# Patient Record
Sex: Male | Born: 1980 | Race: White | Hispanic: No | Marital: Married | Smoking: Never smoker
Health system: Southern US, Community
[De-identification: ages and names within clinical notes are randomized; demographics above are authoritative.]

## PROBLEM LIST (undated history)

## (undated) DIAGNOSIS — M629 Disorder of muscle, unspecified: Secondary | ICD-10-CM

## (undated) DIAGNOSIS — F419 Anxiety disorder, unspecified: Secondary | ICD-10-CM

## (undated) HISTORY — PX: EYE SURGERY: SHX253

## (undated) HISTORY — DX: Anxiety disorder, unspecified: F41.9

## (undated) HISTORY — DX: Disorder of muscle, unspecified: M62.9

---

## 1998-09-29 ENCOUNTER — Emergency Department (HOSPITAL_COMMUNITY): Admission: EM | Admit: 1998-09-29 | Discharge: 1998-09-30 | Payer: Self-pay | Admitting: Emergency Medicine

## 2002-02-15 ENCOUNTER — Emergency Department (HOSPITAL_COMMUNITY): Admission: EM | Admit: 2002-02-15 | Discharge: 2002-02-16 | Payer: Self-pay | Admitting: Emergency Medicine

## 2015-10-29 DIAGNOSIS — F419 Anxiety disorder, unspecified: Secondary | ICD-10-CM | POA: Diagnosis not present

## 2015-10-29 DIAGNOSIS — F900 Attention-deficit hyperactivity disorder, predominantly inattentive type: Secondary | ICD-10-CM | POA: Diagnosis not present

## 2015-10-31 MED FILL — traZODone HCL 50 MG TABS: 50 | 30 days supply | Qty: 30 | Fill #1

## 2015-10-31 MED FILL — hydrOXYzine HCL 25 MG TABS: 25 | 30 days supply | Qty: 60 | Fill #0

## 2015-10-31 MED FILL — CITALOPRAM HBR 20 MG TABLET: 20 | 30 days supply | Qty: 30 | Fill #1

## 2015-10-31 MED FILL — AMPHETAMINE SALTS 15 MG TAB: 15 | 30 days supply | Qty: 90 | Fill #0

## 2015-12-02 MED FILL — AMPHETAMINE SALTS 15 MG TAB: 15 | 30 days supply | Qty: 90 | Fill #0

## 2015-12-26 DIAGNOSIS — F419 Anxiety disorder, unspecified: Secondary | ICD-10-CM | POA: Diagnosis not present

## 2015-12-26 DIAGNOSIS — F900 Attention-deficit hyperactivity disorder, predominantly inattentive type: Secondary | ICD-10-CM | POA: Diagnosis not present

## 2015-12-30 MED FILL — CITALOPRAM HBR 20 MG TABLET: 20 | 30 days supply | Qty: 30 | Fill #0

## 2015-12-30 MED FILL — traZODone HCL 50 MG TABS: 50 | 30 days supply | Qty: 30 | Fill #0

## 2015-12-30 MED FILL — hydrOXYzine HCL 25 MG TABS: 25 | 30 days supply | Qty: 60 | Fill #0

## 2015-12-30 MED FILL — AMPHETAMINE SALTS 15 MG TAB: 15 | 30 days supply | Qty: 90 | Fill #0

## 2016-01-28 MED FILL — AMPHETAMINE SALTS 15 MG TAB: 15 | 30 days supply | Qty: 90 | Fill #0

## 2016-02-26 MED FILL — hydrOXYzine HCL 25 MG TABS: 25 | 30 days supply | Qty: 60 | Fill #1

## 2016-02-26 MED FILL — AMPHETAMINE SALTS 15 MG TAB: 15 | 30 days supply | Qty: 90 | Fill #0

## 2016-02-26 MED FILL — CITALOPRAM HBR 20 MG TABLET: 20 | 30 days supply | Qty: 30 | Fill #1

## 2016-02-26 MED FILL — traZODone HCL 50 MG TABS: 50 | 30 days supply | Qty: 30 | Fill #1

## 2016-03-06 DIAGNOSIS — F900 Attention-deficit hyperactivity disorder, predominantly inattentive type: Secondary | ICD-10-CM | POA: Diagnosis not present

## 2016-03-06 DIAGNOSIS — F419 Anxiety disorder, unspecified: Secondary | ICD-10-CM | POA: Diagnosis not present

## 2016-03-26 MED FILL — traZODone HCL 50 MG TABS: 50 | 30 days supply | Qty: 30 | Fill #0

## 2016-03-26 MED FILL — CITALOPRAM HBR 20 MG TABLET: 20 | 30 days supply | Qty: 30 | Fill #0

## 2016-03-26 MED FILL — AMPHETAMINE SALTS 15 MG TAB: 15 | 30 days supply | Qty: 90 | Fill #0

## 2016-03-26 MED FILL — hydrOXYzine HCL 25 MG TABS: 25 | 30 days supply | Qty: 60 | Fill #0

## 2016-04-20 MED FILL — OSELTAMIVIR PHOS 75 MG CAP: 75 | 10 days supply | Qty: 10 | Fill #0

## 2016-04-23 MED FILL — hydrOXYzine HCL 25 MG TABS: 25 | 30 days supply | Qty: 60 | Fill #1

## 2016-04-23 MED FILL — CITALOPRAM HBR 20 MG TABLET: 20 | 30 days supply | Qty: 30 | Fill #1

## 2016-04-23 MED FILL — traZODone HCL 50 MG TABS: 50 | 30 days supply | Qty: 30 | Fill #1

## 2016-04-23 MED FILL — AMPHETAMINE SALTS 15 MG TAB: 15 | 30 days supply | Qty: 90 | Fill #0

## 2016-05-01 DIAGNOSIS — F419 Anxiety disorder, unspecified: Secondary | ICD-10-CM | POA: Diagnosis not present

## 2016-05-01 DIAGNOSIS — F900 Attention-deficit hyperactivity disorder, predominantly inattentive type: Secondary | ICD-10-CM | POA: Diagnosis not present

## 2016-05-26 MED FILL — CITALOPRAM HBR 20 MG TABLET: 20 | 30 days supply | Qty: 30 | Fill #0

## 2016-05-26 MED FILL — hydrOXYzine HCL 25 MG TABS: 25 | 30 days supply | Qty: 60 | Fill #0

## 2016-05-26 MED FILL — traZODone HCL 50 MG TABS: 50 | 30 days supply | Qty: 30 | Fill #0

## 2016-05-26 MED FILL — AMPHETAMINE SALTS 15 MG TAB: 15 | 30 days supply | Qty: 90 | Fill #0

## 2016-06-24 MED FILL — traZODone HCL 50 MG TABS: 50 | 30 days supply | Qty: 30 | Fill #0

## 2016-06-24 MED FILL — CITALOPRAM HBR 20 MG TABLET: 20 | 30 days supply | Qty: 30 | Fill #0

## 2016-06-24 MED FILL — hydrOXYzine HCL 25 MG TABS: 25 | 30 days supply | Qty: 60 | Fill #0

## 2016-06-24 MED FILL — AMPHETAMINE SALTS 15 MG TAB: 15 | 30 days supply | Qty: 90 | Fill #0

## 2016-07-07 DIAGNOSIS — F419 Anxiety disorder, unspecified: Secondary | ICD-10-CM | POA: Diagnosis not present

## 2016-07-07 DIAGNOSIS — F900 Attention-deficit hyperactivity disorder, predominantly inattentive type: Secondary | ICD-10-CM | POA: Diagnosis not present

## 2016-07-22 MED FILL — AMPHETAMINE SALTS 15 MG TAB: 15 | 30 days supply | Qty: 90 | Fill #0

## 2016-08-20 MED FILL — AMPHETAMINE SALTS 15 MG TAB: 15 | 30 days supply | Qty: 90 | Fill #0

## 2016-09-01 DIAGNOSIS — F419 Anxiety disorder, unspecified: Secondary | ICD-10-CM | POA: Diagnosis not present

## 2016-09-01 DIAGNOSIS — F900 Attention-deficit hyperactivity disorder, predominantly inattentive type: Secondary | ICD-10-CM | POA: Diagnosis not present

## 2016-09-17 MED FILL — hydrOXYzine HCL 25 MG TABS: 25 | 30 days supply | Qty: 60 | Fill #0

## 2016-09-17 MED FILL — AMPHETAMINE SALTS 15 MG TAB: 15 | 30 days supply | Qty: 90 | Fill #0

## 2016-09-17 MED FILL — traZODone HCL 50 MG TABS: 50 | 30 days supply | Qty: 30 | Fill #0

## 2016-09-17 MED FILL — CITALOPRAM HBR 40 MG TABLET: 40 | 30 days supply | Qty: 15 | Fill #0

## 2016-10-16 MED FILL — AMPHETAMINE SALTS 15 MG TAB: 15 | 30 days supply | Qty: 90 | Fill #0

## 2016-10-16 MED FILL — CITALOPRAM HBR 40 MG TABLET: 40 | 30 days supply | Qty: 15 | Fill #1

## 2016-10-16 MED FILL — hydrOXYzine HCL 25 MG TABS: 25 | 30 days supply | Qty: 60 | Fill #1

## 2016-10-16 MED FILL — traZODone HCL 50 MG TABS: 50 | 30 days supply | Qty: 30 | Fill #1

## 2016-10-27 DIAGNOSIS — F419 Anxiety disorder, unspecified: Secondary | ICD-10-CM | POA: Diagnosis not present

## 2016-10-27 DIAGNOSIS — F900 Attention-deficit hyperactivity disorder, predominantly inattentive type: Secondary | ICD-10-CM | POA: Diagnosis not present

## 2016-11-16 MED FILL — AMPHETAMINE SALTS 15 MG TAB: 15 | 30 days supply | Qty: 90 | Fill #0

## 2016-12-22 DIAGNOSIS — F900 Attention-deficit hyperactivity disorder, predominantly inattentive type: Secondary | ICD-10-CM | POA: Diagnosis not present

## 2016-12-22 DIAGNOSIS — F419 Anxiety disorder, unspecified: Secondary | ICD-10-CM | POA: Diagnosis not present

## 2016-12-22 MED FILL — traZODone HCL 50 MG TABS: 50 | 30 days supply | Qty: 30 | Fill #0

## 2016-12-22 MED FILL — AMPHETAMINE SALTS 15 MG TAB: 15 | 30 days supply | Qty: 90 | Fill #0

## 2016-12-22 MED FILL — CITALOPRAM HBR 20 MG TABLET: 20 | 30 days supply | Qty: 30 | Fill #0

## 2016-12-22 MED FILL — hydrOXYzine HCL 25 MG TABS: 25 | 30 days supply | Qty: 60 | Fill #0

## 2017-01-22 MED FILL — AMPHETAMINE SALTS 15 MG TAB: 15 | 30 days supply | Qty: 90 | Fill #0

## 2017-02-04 ENCOUNTER — Ambulatory Visit (INDEPENDENT_AMBULATORY_CARE_PROVIDER_SITE_OTHER): Payer: 59 | Admitting: Emergency Medicine

## 2017-02-04 ENCOUNTER — Ambulatory Visit (INDEPENDENT_AMBULATORY_CARE_PROVIDER_SITE_OTHER): Payer: 59

## 2017-02-04 VITALS — BP 122/82 | HR 107 | Temp 98.8°F | Resp 18 | Wt 198.8 lb

## 2017-02-04 DIAGNOSIS — K2900 Acute gastritis without bleeding: Secondary | ICD-10-CM | POA: Insufficient documentation

## 2017-02-04 DIAGNOSIS — R1013 Epigastric pain: Secondary | ICD-10-CM

## 2017-02-04 MED ORDER — RANITIDINE HCL 300 MG PO TABS: 300.0000 mg | ORAL_TABLET | Freq: Every day | ORAL | 1 refills | Status: DC

## 2017-02-04 MED ORDER — OMEPRAZOLE 40 MG PO CPDR: 40.0000 mg | DELAYED_RELEASE_CAPSULE | Freq: Every day | ORAL | 3 refills | Status: DC

## 2017-02-04 MED FILL — OMEPRAZOLE DR 40 MG CAPSULE: 40 | 30 days supply | Qty: 30 | Fill #0

## 2017-02-04 MED FILL — raNITIdine HCL 300 MG TABS: 300 | 10 days supply | Qty: 10 | Fill #0

## 2017-02-04 NOTE — Patient Instructions (Addendum)
  Stop Ibuprofen; take Tylenol as needed for pain. Take Maalox 2-3 times/day for the next 5 days.   IF you received an x-ray today, you will receive an invoice from [facility]. Please contact [facility] at [phone] with questions or concerns regarding your invoice.   IF you received labwork today, you will receive an invoice from [place]. Please contact [facility] at [phone] with questions or concerns regarding your invoice.   Our billing staff will not be able to assist you with questions regarding bills from these companies.  You will be contacted with the lab results as soon as they are available. The fastest way to get your results is to activate your My Chart account. Instructions are located on the last page of this paperwork. If you have not heard from [place] regarding the results in 2 weeks, please contact this office.      Gastritis, Adult Gastritis is swelling (inflammation) of the stomach. When you have this condition, you can have these problems (symptoms):  Pain in your stomach.  A burning feeling in your stomach.  Feeling sick to your stomach (nauseous).  Throwing up (vomiting).  Feeling too full after you eat. It is important to get help for this condition. Without help, your stomach can bleed, and you can get sores (ulcers) in your stomach. Follow these instructions at home:  Take over-the-counter and prescription medicines only as told by your doctor.  If you were prescribed an antibiotic medicine, take it as told by your doctor. Do not stop taking it even if you start to feel better.  Drink enough fluid to keep your pee (urine) clear or pale yellow.  Instead of eating big meals, eat small meals often. Contact a health care provider if:  Your problems get worse.  Your problems go away and then come back. Get help right away if:  You throw up blood or something that looks like coffee grounds.  You have black or dark red poop  (stools).  You cannot keep fluids down.  Your stomach pain gets worse.  You have a fever.  You do not feel better after 1 week. This information is not intended to replace advice given to you by your health care provider. Make sure you discuss any questions you have with your health care provider. Document Released: 03/16/2008 Document Revised: 05/27/2016 Document Reviewed: 06/22/2015 Elsevier Interactive Patient Education  2017 [employer].

## 2017-02-04 NOTE — Progress Notes (Signed)
Isaac Ellis 36 y.o.   Chief Complaint  Patient presents with  . Abdominal Pain    upper area x1day   . Shortness of Breath    pt says cant take a full breath  . Shoulder Pain    left shoulder   . Back Pain    HISTORY OF PRESENT ILLNESS: This is a 36 y.o. male complaining of epigastric pain since 1am today; no other significant symptoms; pain radiated to left shoulder; Father had MI at age 22 (smoker). Pt denies CAD risk factors. Works out (runs) regularly and does not get any chest pains. Has been taking Ibuprofen for back pain for the past week.  HPI   Prior to Admission medications   Medication Sig Start Date End Date Taking? Authorizing Provider  amphetamine-dextroamphetamine (ADDERALL) 30 MG tablet Take 30 mg by mouth daily.   Yes Historical Provider, MD    Not on File  There are no active problems to display for this patient.   Past Medical History:  Diagnosis Date  . Anxiety     Past Surgical History:  Procedure Laterality Date  . EYE SURGERY      Social History   Social History  . Marital status: Married    Spouse name: N/A  . Number of children: N/A  . Years of education: N/A   Occupational History  . Not on file.   Social History Main Topics  . Smoking status: Never Smoker  . Smokeless tobacco: Never Used  . Alcohol use No  . Drug use: Unknown  . Sexual activity: Not on file   Other Topics Concern  . Not on file   Social History Narrative  . No narrative on file    Family History  Problem Relation Age of Onset  . Cancer Mother   . Heart disease Father   . Hyperlipidemia Father   . Hypertension Father   . Cancer Sister   . Cancer Maternal Grandfather   . Stroke Paternal Grandmother   . Cancer Paternal Grandfather      Review of Systems  Constitutional: Negative for chills, fever, malaise/fatigue and weight loss.  HENT: Negative.   Eyes: Negative.   Respiratory: Negative.  Negative for cough, hemoptysis and shortness of  breath.   Cardiovascular: Negative for chest pain and leg swelling.  Gastrointestinal: Positive for abdominal pain. Negative for blood in stool, constipation, diarrhea, melena, nausea and vomiting.  Genitourinary: Negative.  Negative for dysuria and hematuria.  Musculoskeletal: Positive for back pain. Negative for myalgias.  Skin: Negative for rash.  Neurological: Negative for dizziness and headaches.  Endo/Heme/Allergies: Negative.   All other systems reviewed and are negative.  EKG: NSR with no acute ischemic changes. CXR: NAD Vitals:   02/04/17 1052  BP: 122/82  Pulse: (!) 107  Resp: 18  Temp: 98.8 F (37.1 C)    Physical Exam  Constitutional: He is oriented to person, place, and time. He appears well-developed and well-nourished.  HENT:  Head: Normocephalic and atraumatic.  Nose: Nose normal.  Mouth/Throat: Oropharynx is clear and moist.  Eyes: Conjunctivae and EOM are normal. Pupils are equal, round, and reactive to light.  Neck: Normal range of motion. Neck supple. No JVD present. No thyromegaly present.  Cardiovascular: Normal rate, regular rhythm, normal heart sounds and intact distal pulses.   Pulmonary/Chest: Effort normal and breath sounds normal.  Abdominal: Bowel sounds are normal. He exhibits no distension and no mass. There is tenderness (epigastrum). There is no rebound and no  guarding.  Musculoskeletal: Normal range of motion.  Lymphadenopathy:    He has no cervical adenopathy.  Neurological: He is alert and oriented to person, place, and time. No sensory deficit. He exhibits normal muscle tone.  Skin: Skin is warm and dry. Capillary refill takes less than 2 seconds. No rash noted.  Psychiatric: He has a normal mood and affect. His behavior is normal.  Vitals reviewed.    ASSESSMENT & PLAN: Isaac Ellis was seen today for abdominal pain, shortness of breath, shoulder pain and back pain.  Diagnoses and all orders for this visit:  Abdominal pain, epigastric -      CBC with Differential/Platelet -     Comprehensive metabolic panel -     EKG 12-Lead -     DG Chest 2 View; Future -     Amylase  Acute gastritis without hemorrhage, unspecified gastritis type  Other orders -     omeprazole (PRILOSEC) 40 MG capsule; Take 1 capsule (40 mg total) by mouth daily. -     ranitidine (ZANTAC) 300 MG tablet; Take 1 tablet (300 mg total) by mouth at bedtime.    Patient Instructions    Stop Ibuprofen; take Tylenol as needed for pain. Take Maalox 2-3 times/day for the next 5 days.   IF you received an x-ray today, you will receive an invoice from [facility]. Please contact [facility] at [phone] with questions or concerns regarding your invoice.   IF you received labwork today, you will receive an invoice from [place]. Please contact [facility] at [phone] with questions or concerns regarding your invoice.   Our billing staff will not be able to assist you with questions regarding bills from these companies.  You will be contacted with the lab results as soon as they are available. The fastest way to get your results is to activate your My Chart account. Instructions are located on the last page of this paperwork. If you have not heard from [place] regarding the results in 2 weeks, please contact this office.      Gastritis, Adult Gastritis is swelling (inflammation) of the stomach. When you have this condition, you can have these problems (symptoms):  Pain in your stomach.  A burning feeling in your stomach.  Feeling sick to your stomach (nauseous).  Throwing up (vomiting).  Feeling too full after you eat. It is important to get help for this condition. Without help, your stomach can bleed, and you can get sores (ulcers) in your stomach. Follow these instructions at home:  Take over-the-counter and prescription medicines only as told by your doctor.  If you were prescribed an antibiotic medicine, take it as told by your  doctor. Do not stop taking it even if you start to feel better.  Drink enough fluid to keep your pee (urine) clear or pale yellow.  Instead of eating big meals, eat small meals often. Contact a health care provider if:  Your problems get worse.  Your problems go away and then come back. Get help right away if:  You throw up blood or something that looks like coffee grounds.  You have black or dark red poop (stools).  You cannot keep fluids down.  Your stomach pain gets worse.  You have a fever.  You do not feel better after 1 week. This information is not intended to replace advice given to you by your health care provider. Make sure you discuss any questions you have with your health care provider. Document Released: 03/16/2008 Document  Revised: 05/27/2016 Document Reviewed: 06/22/2015 Elsevier Interactive Patient Education  2017 Elsevier Inc.      Edwina Barth, MD [facility]

## 2017-02-05 LAB — COMPREHENSIVE METABOLIC PANEL
ALBUMIN: 4.7 g/dL (ref 3.5–5.5)
ALT: 33 IU/L (ref 0–44)
AST: 17 IU/L (ref 0–40)
Albumin/Globulin Ratio: 2 (ref 1.2–2.2)
Alkaline Phosphatase: 88 IU/L (ref 39–117)
BILIRUBIN TOTAL: 1.3 mg/dL — AB (ref 0.0–1.2)
BUN / CREAT RATIO: 15 (ref 9–20)
BUN: 14 mg/dL (ref 6–20)
CALCIUM: 10 mg/dL (ref 8.7–10.2)
CO2: 27 mmol/L (ref 18–29)
CREATININE: 0.93 mg/dL (ref 0.76–1.27)
Chloride: 100 mmol/L (ref 96–106)
GFR, EST AFRICAN AMERICAN: 121 mL/min/{1.73_m2} (ref >59)
GFR, EST NON AFRICAN AMERICAN: 105 mL/min/{1.73_m2} (ref >59)
GLUCOSE: 85 mg/dL (ref 65–99)
Globulin, Total: 2.4 g/dL (ref 1.5–4.5)
Potassium: 5.1 mmol/L (ref 3.5–5.2)
Sodium: 141 mmol/L (ref 134–144)
TOTAL PROTEIN: 7.1 g/dL (ref 6.0–8.5)

## 2017-02-05 LAB — CBC WITH DIFFERENTIAL/PLATELET
BASOS ABS: 0 10*3/uL (ref 0.0–0.2)
BASOS: 0 %
EOS (ABSOLUTE): 0.1 10*3/uL (ref 0.0–0.4)
Eos: 1 %
HEMOGLOBIN: 16 g/dL (ref 13.0–17.7)
Hematocrit: 47.9 % (ref 37.5–51.0)
IMMATURE GRANS (ABS): 0 10*3/uL (ref 0.0–0.1)
IMMATURE GRANULOCYTES: 0 %
LYMPHS: 11 %
Lymphocytes Absolute: 1.1 10*3/uL (ref 0.7–3.1)
MCH: 30 pg (ref 26.6–33.0)
MCHC: 33.4 g/dL (ref 31.5–35.7)
MCV: 90 fL (ref 79–97)
MONOCYTES: 10 %
Monocytes Absolute: 1 10*3/uL — ABNORMAL HIGH (ref 0.1–0.9)
NEUTROS ABS: 7.6 10*3/uL — AB (ref 1.4–7.0)
Neutrophils: 78 %
Platelets: 279 10*3/uL (ref 150–379)
RBC: 5.34 x10E6/uL (ref 4.14–5.80)
RDW: 14.2 % (ref 12.3–15.4)
WBC: 9.9 10*3/uL (ref 3.4–10.8)

## 2017-02-05 LAB — AMYLASE: Amylase: 71 U/L (ref 31–124)

## 2017-02-16 DIAGNOSIS — F900 Attention-deficit hyperactivity disorder, predominantly inattentive type: Secondary | ICD-10-CM | POA: Diagnosis not present

## 2017-02-16 DIAGNOSIS — F419 Anxiety disorder, unspecified: Secondary | ICD-10-CM | POA: Diagnosis not present

## 2017-02-16 MED FILL — CITALOPRAM HBR 20 MG TABLET: 20 | 30 days supply | Qty: 30 | Fill #0

## 2017-02-16 MED FILL — PROPRANOLOL 20 MG TABLET: 20 | 30 days supply | Qty: 60 | Fill #0

## 2017-02-16 MED FILL — traZODone HCL 50 MG TABS: 50 | 30 days supply | Qty: 30 | Fill #0

## 2017-02-22 MED FILL — AMPHETAMINE SALTS 15 MG TAB: 15 | 30 days supply | Qty: 90 | Fill #0

## 2017-03-22 MED FILL — AMPHETAMINE SALTS 15 MG TAB: 15 | 30 days supply | Qty: 90 | Fill #0

## 2017-04-13 DIAGNOSIS — F419 Anxiety disorder, unspecified: Secondary | ICD-10-CM | POA: Diagnosis not present

## 2017-04-13 DIAGNOSIS — F900 Attention-deficit hyperactivity disorder, predominantly inattentive type: Secondary | ICD-10-CM | POA: Diagnosis not present

## 2017-04-13 MED FILL — PROPRANOLOL 20 MG TABLET: 20 | 30 days supply | Qty: 60 | Fill #0

## 2017-04-13 MED FILL — CITALOPRAM HBR 20 MG TABLET: 20 | 30 days supply | Qty: 30 | Fill #0

## 2017-04-13 MED FILL — traZODone HCL 50 MG TABS: 50 | 30 days supply | Qty: 30 | Fill #0

## 2017-04-23 MED FILL — AMPHETAMINE SALTS 15 MG TAB: 15 | 30 days supply | Qty: 90 | Fill #0

## 2017-05-21 MED FILL — AMPHETAMINE SALTS 15 MG TAB: 15 | 30 days supply | Qty: 90 | Fill #0

## 2017-06-15 ENCOUNTER — Ambulatory Visit (INDEPENDENT_AMBULATORY_CARE_PROVIDER_SITE_OTHER): Payer: 59 | Admitting: Orthopaedic Surgery

## 2017-06-15 ENCOUNTER — Ambulatory Visit (INDEPENDENT_AMBULATORY_CARE_PROVIDER_SITE_OTHER): Payer: 59

## 2017-06-15 ENCOUNTER — Encounter (INDEPENDENT_AMBULATORY_CARE_PROVIDER_SITE_OTHER): Payer: Self-pay | Admitting: Orthopaedic Surgery

## 2017-06-15 DIAGNOSIS — M5416 Radiculopathy, lumbar region: Secondary | ICD-10-CM | POA: Insufficient documentation

## 2017-06-15 MED ORDER — PREDNISONE 10 MG (21) PO TBPK: ORAL_TABLET | ORAL | 0 refills | Status: DC

## 2017-06-15 MED ORDER — NAPROXEN 500 MG PO TABS: 500.0000 mg | ORAL_TABLET | Freq: Two times a day (BID) | ORAL | 3 refills | Status: DC

## 2017-06-15 MED ORDER — TIZANIDINE HCL 4 MG PO TABS: 4.0000 mg | ORAL_TABLET | Freq: Four times a day (QID) | ORAL | 2 refills | Status: DC | PRN

## 2017-06-15 MED ORDER — HYDROCODONE-ACETAMINOPHEN 5-325 MG PO TABS: 1.0000 | ORAL_TABLET | Freq: Two times a day (BID) | ORAL | 0 refills | Status: DC | PRN

## 2017-06-15 MED FILL — predniSONE 10 MG TABS: 10 | 6 days supply | Qty: 21 | Fill #0

## 2017-06-15 MED FILL — tiZANidine HCL 4 MG TABS: 4 | 5 days supply | Qty: 30 | Fill #0

## 2017-06-15 MED FILL — NAPROXEN 500 MG TABLET: 500 | 15 days supply | Qty: 30 | Fill #0

## 2017-06-15 MED FILL — HYDROCODON-APAP 5-325: 5-325 | 10 days supply | Qty: 20 | Fill #0

## 2017-06-15 NOTE — Progress Notes (Signed)
Office Visit Note   Patient: Isaac Ellis           Date of Birth: 1981/05/31           MRN: [id number] Visit Date: 06/15/2017              Requested by: No referring provider defined for this encounter. PCP: Patient, No Pcp Per   Assessment & Plan: Visit Diagnoses:  1. Lumbar radiculopathy, acute     Plan: Overall impression is lumbar radiculopathy. Prescriptions given today. If not better we'll consider MRI.  Follow-Up Instructions: Return if symptoms worsen or fail to improve.   Orders:  Orders Placed This Encounter  Procedures  . XR HIP UNILAT W OR W/O PELVIS 2-3 VIEWS RIGHT   Meds ordered this encounter  Medications  . HYDROcodone-acetaminophen (NORCO) 5-325 MG tablet    Sig: Take 1 tablet by mouth 2 (two) times daily as needed.    Dispense:  20 tablet    Refill:  0  . predniSONE (STERAPRED UNI-PAK 21 TAB) 10 MG (21) TBPK tablet    Sig: Take as directed    Dispense:  21 tablet    Refill:  0  . tiZANidine (ZANAFLEX) 4 MG tablet    Sig: Take 1 tablet (4 mg total) by mouth every 6 (six) hours as needed for muscle spasms.    Dispense:  30 tablet    Refill:  2  . naproxen (NAPROSYN) 500 MG tablet    Sig: Take 1 tablet (500 mg total) by mouth 2 (two) times daily with a meal.    Dispense:  30 tablet    Refill:  3      Procedures: No procedures performed   Clinical Data: No additional findings.   Subjective: Chief Complaint  Patient presents with  . Right Hip - Pain    Patient is a 36 year old male who has had 1 month history of burning pain radiating from his low back down to the top of his foot. The pain is severe with activity and with standing. He has pain when he coughs and sneezes. Denies any bowel or bladder dysfunction.    Review of Systems  Constitutional: Negative.   All other systems reviewed and are negative.    Objective: Vital Signs: There were no vitals taken for this visit.  Physical Exam  Constitutional: He is oriented to  person, place, and time. He appears well-developed and well-nourished.  HENT:  Head: Normocephalic and atraumatic.  Eyes: Pupils are equal, round, and reactive to light.  Neck: Neck supple.  Pulmonary/Chest: Effort normal.  Abdominal: Soft.  Musculoskeletal: Normal range of motion.  Neurological: He is alert and oriented to person, place, and time.  Skin: Skin is warm.  Psychiatric: He has a normal mood and affect. His behavior is normal. Judgment and thought content normal.  Nursing note and vitals reviewed.   Ortho Exam Right lower extremity exam shows positive straight leg raise test. Hip range of motion is asymptomatic. Lateral hip is nontender. No focal motor or sensory deficits. Slightly hyperreflexic symmetric patellar reflexes Specialty Comments:  No specialty comments available.  Imaging: Xr Hip Unilat W Or W/o Pelvis 2-3 Views Right  Result Date: 06/15/2017 Negative for acute abnormalities    PMFS History: Patient Active Problem List   Diagnosis Date Noted  . Lumbar radiculopathy, acute 06/15/2017  . Abdominal pain, epigastric 02/04/2017  . Acute gastritis without hemorrhage 02/04/2017   Past Medical History:  Diagnosis Date  . Anxiety  Family History  Problem Relation Age of Onset  . Cancer Mother   . Heart disease Father   . Hyperlipidemia Father   . Hypertension Father   . Cancer Sister   . Cancer Maternal Grandfather   . Stroke Paternal Grandmother   . Cancer Paternal Grandfather     Past Surgical History:  Procedure Laterality Date  . EYE SURGERY     Social History   Occupational History  . Not on file.   Social History Main Topics  . Smoking status: Never Smoker  . Smokeless tobacco: Never Used  . Alcohol use No  . Drug use: Unknown  . Sexual activity: Not on file

## 2017-06-16 DIAGNOSIS — F419 Anxiety disorder, unspecified: Secondary | ICD-10-CM | POA: Diagnosis not present

## 2017-06-16 DIAGNOSIS — F900 Attention-deficit hyperactivity disorder, predominantly inattentive type: Secondary | ICD-10-CM | POA: Diagnosis not present

## 2017-06-16 MED FILL — traZODone HCL 50 MG TABS: 50 | 30 days supply | Qty: 30 | Fill #0

## 2017-06-16 MED FILL — CITALOPRAM HBR 20 MG TABLET: 20 | 30 days supply | Qty: 30 | Fill #0

## 2017-06-16 MED FILL — PROPRANOLOL 20 MG TABLET: 20 | 30 days supply | Qty: 60 | Fill #0

## 2017-06-18 MED FILL — AMPHETAMINE SALTS 15 MG TAB: 15 | 30 days supply | Qty: 90 | Fill #0

## 2017-06-30 MED FILL — tiZANidine HCL 4 MG TABS: 4 | 5 days supply | Qty: 30 | Fill #1

## 2017-07-19 MED FILL — AMPHETAMINE SALTS 15 MG TAB: 15 | 30 days supply | Qty: 90 | Fill #0

## 2017-07-27 MED FILL — tiZANidine HCL 4 MG TABS: 4 | 5 days supply | Qty: 30 | Fill #2

## 2017-08-02 DIAGNOSIS — M9905 Segmental and somatic dysfunction of pelvic region: Secondary | ICD-10-CM | POA: Diagnosis not present

## 2017-08-02 DIAGNOSIS — M2560 Stiffness of unspecified joint, not elsewhere classified: Secondary | ICD-10-CM | POA: Diagnosis not present

## 2017-08-02 DIAGNOSIS — M9902 Segmental and somatic dysfunction of thoracic region: Secondary | ICD-10-CM | POA: Diagnosis not present

## 2017-08-02 DIAGNOSIS — M9903 Segmental and somatic dysfunction of lumbar region: Secondary | ICD-10-CM | POA: Diagnosis not present

## 2017-08-02 DIAGNOSIS — R293 Abnormal posture: Secondary | ICD-10-CM | POA: Diagnosis not present

## 2017-08-02 DIAGNOSIS — M545 Low back pain: Secondary | ICD-10-CM | POA: Diagnosis not present

## 2017-08-02 DIAGNOSIS — M5431 Sciatica, right side: Secondary | ICD-10-CM | POA: Diagnosis not present

## 2017-08-11 DIAGNOSIS — M545 Low back pain: Secondary | ICD-10-CM | POA: Diagnosis not present

## 2017-08-11 DIAGNOSIS — M9903 Segmental and somatic dysfunction of lumbar region: Secondary | ICD-10-CM | POA: Diagnosis not present

## 2017-08-11 DIAGNOSIS — M5431 Sciatica, right side: Secondary | ICD-10-CM | POA: Diagnosis not present

## 2017-08-11 DIAGNOSIS — M2560 Stiffness of unspecified joint, not elsewhere classified: Secondary | ICD-10-CM | POA: Diagnosis not present

## 2017-08-11 DIAGNOSIS — M9905 Segmental and somatic dysfunction of pelvic region: Secondary | ICD-10-CM | POA: Diagnosis not present

## 2017-08-11 DIAGNOSIS — M9902 Segmental and somatic dysfunction of thoracic region: Secondary | ICD-10-CM | POA: Diagnosis not present

## 2017-08-11 DIAGNOSIS — R293 Abnormal posture: Secondary | ICD-10-CM | POA: Diagnosis not present

## 2017-08-12 DIAGNOSIS — M9903 Segmental and somatic dysfunction of lumbar region: Secondary | ICD-10-CM | POA: Diagnosis not present

## 2017-08-12 DIAGNOSIS — R293 Abnormal posture: Secondary | ICD-10-CM | POA: Diagnosis not present

## 2017-08-12 DIAGNOSIS — M9902 Segmental and somatic dysfunction of thoracic region: Secondary | ICD-10-CM | POA: Diagnosis not present

## 2017-08-12 DIAGNOSIS — M5431 Sciatica, right side: Secondary | ICD-10-CM | POA: Diagnosis not present

## 2017-08-12 DIAGNOSIS — M2560 Stiffness of unspecified joint, not elsewhere classified: Secondary | ICD-10-CM | POA: Diagnosis not present

## 2017-08-12 DIAGNOSIS — M9905 Segmental and somatic dysfunction of pelvic region: Secondary | ICD-10-CM | POA: Diagnosis not present

## 2017-08-12 DIAGNOSIS — M545 Low back pain: Secondary | ICD-10-CM | POA: Diagnosis not present

## 2017-08-13 DIAGNOSIS — M2560 Stiffness of unspecified joint, not elsewhere classified: Secondary | ICD-10-CM | POA: Diagnosis not present

## 2017-08-13 DIAGNOSIS — M9905 Segmental and somatic dysfunction of pelvic region: Secondary | ICD-10-CM | POA: Diagnosis not present

## 2017-08-13 DIAGNOSIS — M545 Low back pain: Secondary | ICD-10-CM | POA: Diagnosis not present

## 2017-08-13 DIAGNOSIS — M5431 Sciatica, right side: Secondary | ICD-10-CM | POA: Diagnosis not present

## 2017-08-13 DIAGNOSIS — M9902 Segmental and somatic dysfunction of thoracic region: Secondary | ICD-10-CM | POA: Diagnosis not present

## 2017-08-13 DIAGNOSIS — R293 Abnormal posture: Secondary | ICD-10-CM | POA: Diagnosis not present

## 2017-08-13 DIAGNOSIS — M9903 Segmental and somatic dysfunction of lumbar region: Secondary | ICD-10-CM | POA: Diagnosis not present

## 2017-08-16 DIAGNOSIS — M2560 Stiffness of unspecified joint, not elsewhere classified: Secondary | ICD-10-CM | POA: Diagnosis not present

## 2017-08-16 DIAGNOSIS — M545 Low back pain: Secondary | ICD-10-CM | POA: Diagnosis not present

## 2017-08-16 DIAGNOSIS — M9905 Segmental and somatic dysfunction of pelvic region: Secondary | ICD-10-CM | POA: Diagnosis not present

## 2017-08-16 DIAGNOSIS — M9903 Segmental and somatic dysfunction of lumbar region: Secondary | ICD-10-CM | POA: Diagnosis not present

## 2017-08-16 DIAGNOSIS — M5431 Sciatica, right side: Secondary | ICD-10-CM | POA: Diagnosis not present

## 2017-08-16 DIAGNOSIS — R293 Abnormal posture: Secondary | ICD-10-CM | POA: Diagnosis not present

## 2017-08-16 DIAGNOSIS — M9902 Segmental and somatic dysfunction of thoracic region: Secondary | ICD-10-CM | POA: Diagnosis not present

## 2017-08-18 DIAGNOSIS — M5431 Sciatica, right side: Secondary | ICD-10-CM | POA: Diagnosis not present

## 2017-08-18 DIAGNOSIS — M9905 Segmental and somatic dysfunction of pelvic region: Secondary | ICD-10-CM | POA: Diagnosis not present

## 2017-08-18 DIAGNOSIS — M2560 Stiffness of unspecified joint, not elsewhere classified: Secondary | ICD-10-CM | POA: Diagnosis not present

## 2017-08-18 DIAGNOSIS — M545 Low back pain: Secondary | ICD-10-CM | POA: Diagnosis not present

## 2017-08-18 DIAGNOSIS — M9902 Segmental and somatic dysfunction of thoracic region: Secondary | ICD-10-CM | POA: Diagnosis not present

## 2017-08-18 DIAGNOSIS — R293 Abnormal posture: Secondary | ICD-10-CM | POA: Diagnosis not present

## 2017-08-18 DIAGNOSIS — M9903 Segmental and somatic dysfunction of lumbar region: Secondary | ICD-10-CM | POA: Diagnosis not present

## 2017-08-20 DIAGNOSIS — M545 Low back pain: Secondary | ICD-10-CM | POA: Diagnosis not present

## 2017-08-20 DIAGNOSIS — R293 Abnormal posture: Secondary | ICD-10-CM | POA: Diagnosis not present

## 2017-08-20 DIAGNOSIS — M5431 Sciatica, right side: Secondary | ICD-10-CM | POA: Diagnosis not present

## 2017-08-20 DIAGNOSIS — F419 Anxiety disorder, unspecified: Secondary | ICD-10-CM | POA: Diagnosis not present

## 2017-08-20 DIAGNOSIS — M9905 Segmental and somatic dysfunction of pelvic region: Secondary | ICD-10-CM | POA: Diagnosis not present

## 2017-08-20 DIAGNOSIS — F900 Attention-deficit hyperactivity disorder, predominantly inattentive type: Secondary | ICD-10-CM | POA: Diagnosis not present

## 2017-08-20 DIAGNOSIS — M9902 Segmental and somatic dysfunction of thoracic region: Secondary | ICD-10-CM | POA: Diagnosis not present

## 2017-08-20 DIAGNOSIS — M2560 Stiffness of unspecified joint, not elsewhere classified: Secondary | ICD-10-CM | POA: Diagnosis not present

## 2017-08-20 DIAGNOSIS — M9903 Segmental and somatic dysfunction of lumbar region: Secondary | ICD-10-CM | POA: Diagnosis not present

## 2017-08-20 MED FILL — CITALOPRAM HBR 20 MG TABLET: 20 | 30 days supply | Qty: 30 | Fill #0

## 2017-08-20 MED FILL — traZODone HCL 50 MG TABS: 50 | 30 days supply | Qty: 30 | Fill #0

## 2017-08-20 MED FILL — PROPRANOLOL 20 MG TABLET: 20 | 30 days supply | Qty: 60 | Fill #0

## 2017-08-23 DIAGNOSIS — M9902 Segmental and somatic dysfunction of thoracic region: Secondary | ICD-10-CM | POA: Diagnosis not present

## 2017-08-23 DIAGNOSIS — R293 Abnormal posture: Secondary | ICD-10-CM | POA: Diagnosis not present

## 2017-08-23 DIAGNOSIS — M2560 Stiffness of unspecified joint, not elsewhere classified: Secondary | ICD-10-CM | POA: Diagnosis not present

## 2017-08-23 DIAGNOSIS — M9903 Segmental and somatic dysfunction of lumbar region: Secondary | ICD-10-CM | POA: Diagnosis not present

## 2017-08-23 DIAGNOSIS — M5431 Sciatica, right side: Secondary | ICD-10-CM | POA: Diagnosis not present

## 2017-08-23 DIAGNOSIS — M9905 Segmental and somatic dysfunction of pelvic region: Secondary | ICD-10-CM | POA: Diagnosis not present

## 2017-08-23 DIAGNOSIS — M545 Low back pain: Secondary | ICD-10-CM | POA: Diagnosis not present

## 2017-08-23 MED FILL — AMPHETAMINE SALTS 15 MG TAB: 15 | 30 days supply | Qty: 90 | Fill #0

## 2017-08-25 DIAGNOSIS — M9905 Segmental and somatic dysfunction of pelvic region: Secondary | ICD-10-CM | POA: Diagnosis not present

## 2017-08-25 DIAGNOSIS — M2560 Stiffness of unspecified joint, not elsewhere classified: Secondary | ICD-10-CM | POA: Diagnosis not present

## 2017-08-25 DIAGNOSIS — M9903 Segmental and somatic dysfunction of lumbar region: Secondary | ICD-10-CM | POA: Diagnosis not present

## 2017-08-25 DIAGNOSIS — M9902 Segmental and somatic dysfunction of thoracic region: Secondary | ICD-10-CM | POA: Diagnosis not present

## 2017-08-25 DIAGNOSIS — M545 Low back pain: Secondary | ICD-10-CM | POA: Diagnosis not present

## 2017-08-25 DIAGNOSIS — M5431 Sciatica, right side: Secondary | ICD-10-CM | POA: Diagnosis not present

## 2017-08-25 DIAGNOSIS — R293 Abnormal posture: Secondary | ICD-10-CM | POA: Diagnosis not present

## 2017-09-06 DIAGNOSIS — M9902 Segmental and somatic dysfunction of thoracic region: Secondary | ICD-10-CM | POA: Diagnosis not present

## 2017-09-06 DIAGNOSIS — M9905 Segmental and somatic dysfunction of pelvic region: Secondary | ICD-10-CM | POA: Diagnosis not present

## 2017-09-06 DIAGNOSIS — M5431 Sciatica, right side: Secondary | ICD-10-CM | POA: Diagnosis not present

## 2017-09-06 DIAGNOSIS — M2560 Stiffness of unspecified joint, not elsewhere classified: Secondary | ICD-10-CM | POA: Diagnosis not present

## 2017-09-06 DIAGNOSIS — M545 Low back pain: Secondary | ICD-10-CM | POA: Diagnosis not present

## 2017-09-06 DIAGNOSIS — M9903 Segmental and somatic dysfunction of lumbar region: Secondary | ICD-10-CM | POA: Diagnosis not present

## 2017-09-06 DIAGNOSIS — R293 Abnormal posture: Secondary | ICD-10-CM | POA: Diagnosis not present

## 2017-09-08 DIAGNOSIS — M9903 Segmental and somatic dysfunction of lumbar region: Secondary | ICD-10-CM | POA: Diagnosis not present

## 2017-09-08 DIAGNOSIS — M9902 Segmental and somatic dysfunction of thoracic region: Secondary | ICD-10-CM | POA: Diagnosis not present

## 2017-09-08 DIAGNOSIS — M2560 Stiffness of unspecified joint, not elsewhere classified: Secondary | ICD-10-CM | POA: Diagnosis not present

## 2017-09-08 DIAGNOSIS — M5431 Sciatica, right side: Secondary | ICD-10-CM | POA: Diagnosis not present

## 2017-09-08 DIAGNOSIS — R293 Abnormal posture: Secondary | ICD-10-CM | POA: Diagnosis not present

## 2017-09-08 DIAGNOSIS — M9905 Segmental and somatic dysfunction of pelvic region: Secondary | ICD-10-CM | POA: Diagnosis not present

## 2017-09-08 DIAGNOSIS — M545 Low back pain: Secondary | ICD-10-CM | POA: Diagnosis not present

## 2017-09-10 DIAGNOSIS — M545 Low back pain: Secondary | ICD-10-CM | POA: Diagnosis not present

## 2017-09-10 DIAGNOSIS — R293 Abnormal posture: Secondary | ICD-10-CM | POA: Diagnosis not present

## 2017-09-10 DIAGNOSIS — M9905 Segmental and somatic dysfunction of pelvic region: Secondary | ICD-10-CM | POA: Diagnosis not present

## 2017-09-10 DIAGNOSIS — M9902 Segmental and somatic dysfunction of thoracic region: Secondary | ICD-10-CM | POA: Diagnosis not present

## 2017-09-10 DIAGNOSIS — M2560 Stiffness of unspecified joint, not elsewhere classified: Secondary | ICD-10-CM | POA: Diagnosis not present

## 2017-09-10 DIAGNOSIS — M9903 Segmental and somatic dysfunction of lumbar region: Secondary | ICD-10-CM | POA: Diagnosis not present

## 2017-09-10 DIAGNOSIS — M5431 Sciatica, right side: Secondary | ICD-10-CM | POA: Diagnosis not present

## 2017-09-14 ENCOUNTER — Other Ambulatory Visit: Payer: Self-pay | Admitting: Neurological Surgery

## 2017-09-14 DIAGNOSIS — M545 Low back pain: Secondary | ICD-10-CM | POA: Diagnosis not present

## 2017-09-14 DIAGNOSIS — R030 Elevated blood-pressure reading, without diagnosis of hypertension: Secondary | ICD-10-CM | POA: Diagnosis not present

## 2017-09-14 DIAGNOSIS — Z6828 Body mass index (BMI) 28.0-28.9, adult: Secondary | ICD-10-CM | POA: Diagnosis not present

## 2017-09-14 MED FILL — MELOXICAM 7.5 MG TABLET: 7.5 | 30 days supply | Qty: 60 | Fill #0

## 2017-09-14 MED FILL — METHYLPREDNISOLONE 4 MG TAB: 4 | 6 days supply | Qty: 21 | Fill #0

## 2017-09-17 ENCOUNTER — Ambulatory Visit (INDEPENDENT_AMBULATORY_CARE_PROVIDER_SITE_OTHER): Payer: 59 | Admitting: Orthopaedic Surgery

## 2017-09-17 ENCOUNTER — Encounter (INDEPENDENT_AMBULATORY_CARE_PROVIDER_SITE_OTHER): Payer: Self-pay | Admitting: Orthopaedic Surgery

## 2017-09-17 DIAGNOSIS — M5416 Radiculopathy, lumbar region: Secondary | ICD-10-CM | POA: Diagnosis not present

## 2017-09-17 MED ORDER — HYDROCODONE-ACETAMINOPHEN 5-325 MG PO TABS: 1.0000 | ORAL_TABLET | Freq: Two times a day (BID) | ORAL | 0 refills | Status: DC | PRN

## 2017-09-17 MED ORDER — TIZANIDINE HCL 4 MG PO TABS: 4.0000 mg | ORAL_TABLET | Freq: Four times a day (QID) | ORAL | 2 refills | Status: DC | PRN

## 2017-09-17 MED FILL — HYDROCODON-APAP 5-325: 5-325 | 10 days supply | Qty: 20 | Fill #0

## 2017-09-17 MED FILL — tiZANidine HCL 4 MG TABS: 4 | 7 days supply | Qty: 30 | Fill #0

## 2017-09-17 NOTE — Progress Notes (Signed)
   Office Visit Note   Patient: Isaac Ellis           Date of Birth: 08-07-1981           MRN: [id number] Visit Date: 09/17/2017              Requested by: No referring provider defined for this encounter. PCP: Patient, No Pcp Per   Assessment & Plan: Visit Diagnoses:  1. Lumbar radiculopathy, acute     Plan: Norco and Zanaflex were refilled today.  At this point I will have him follow-up with Isaac Ellis to review the MRI and further evaluation and treatment.  Follow-Up Instructions: Return if symptoms worsen or fail to improve.   Orders:  No orders of the defined types were placed in this encounter.  Meds ordered this encounter  Medications  . HYDROcodone-acetaminophen (NORCO) 5-325 MG tablet    Sig: Take 1 tablet by mouth 2 (two) times daily as needed.    Dispense:  20 tablet    Refill:  0  . tiZANidine (ZANAFLEX) 4 MG tablet    Sig: Take 1 tablet (4 mg total) by mouth every 6 (six) hours as needed for muscle spasms.    Dispense:  30 tablet    Refill:  2      Procedures: No procedures performed   Clinical Data: No additional findings.   Subjective: Chief Complaint  Patient presents with  . Lower Back - Pain  . Right Hip - Pain    Patient follows up today for continued right for leg radiculopathy and low back pain.  He is slightly better.  He has an MRI scheduled for December 17 and follow-up with Isaac Ellis of neurosurgery.  He has tried chiropractic care and yoga with partial relief.  Denies any numbness and tingling that is any worse.    Review of Systems   Objective: Vital Signs: There were no vitals taken for this visit.  Physical Exam  Ortho Exam Exam is stable. Specialty Comments:  No specialty comments available.  Imaging: No results found.   PMFS History: Patient Active Problem List   Diagnosis Date Noted  . Lumbar radiculopathy, acute 06/15/2017  . Abdominal pain, epigastric 02/04/2017  . Acute gastritis without  hemorrhage 02/04/2017   Past Medical History:  Diagnosis Date  . Anxiety     Family History  Problem Relation Age of Onset  . Cancer Mother   . Heart disease Father   . Hyperlipidemia Father   . Hypertension Father   . Cancer Sister   . Cancer Maternal Grandfather   . Stroke Paternal Grandmother   . Cancer Paternal Grandfather     Past Surgical History:  Procedure Laterality Date  . EYE SURGERY     Social History   Occupational History  . Not on file  Tobacco Use  . Smoking status: Never Smoker  . Smokeless tobacco: Never Used  Substance and Sexual Activity  . Alcohol use: No  . Drug use: Not on file  . Sexual activity: Not on file

## 2017-09-20 MED FILL — DEXTROAMP-AMPHETAMIN 15 MG: 15 | 30 days supply | Qty: 90 | Fill #0

## 2017-09-22 DIAGNOSIS — M545 Low back pain: Secondary | ICD-10-CM | POA: Diagnosis not present

## 2017-09-22 DIAGNOSIS — M9902 Segmental and somatic dysfunction of thoracic region: Secondary | ICD-10-CM | POA: Diagnosis not present

## 2017-09-22 DIAGNOSIS — M5431 Sciatica, right side: Secondary | ICD-10-CM | POA: Diagnosis not present

## 2017-09-22 DIAGNOSIS — R293 Abnormal posture: Secondary | ICD-10-CM | POA: Diagnosis not present

## 2017-09-22 DIAGNOSIS — M2560 Stiffness of unspecified joint, not elsewhere classified: Secondary | ICD-10-CM | POA: Diagnosis not present

## 2017-09-22 DIAGNOSIS — M9903 Segmental and somatic dysfunction of lumbar region: Secondary | ICD-10-CM | POA: Diagnosis not present

## 2017-09-22 DIAGNOSIS — M9905 Segmental and somatic dysfunction of pelvic region: Secondary | ICD-10-CM | POA: Diagnosis not present

## 2017-09-27 ENCOUNTER — Ambulatory Visit (INDEPENDENT_AMBULATORY_CARE_PROVIDER_SITE_OTHER): Payer: 59

## 2017-09-27 DIAGNOSIS — M545 Low back pain: Secondary | ICD-10-CM | POA: Diagnosis not present

## 2017-09-27 DIAGNOSIS — M5117 Intervertebral disc disorders with radiculopathy, lumbosacral region: Secondary | ICD-10-CM

## 2017-09-27 DIAGNOSIS — M5431 Sciatica, right side: Secondary | ICD-10-CM | POA: Diagnosis not present

## 2017-09-27 DIAGNOSIS — M47896 Other spondylosis, lumbar region: Secondary | ICD-10-CM | POA: Diagnosis not present

## 2017-10-08 MED FILL — tiZANidine HCL 4 MG TABS: 4 | 7 days supply | Qty: 30 | Fill #1

## 2017-10-11 ENCOUNTER — Other Ambulatory Visit (INDEPENDENT_AMBULATORY_CARE_PROVIDER_SITE_OTHER): Payer: Self-pay | Admitting: Orthopaedic Surgery

## 2017-10-11 NOTE — Telephone Encounter (Signed)
yes

## 2017-10-11 NOTE — Telephone Encounter (Signed)
XU patient 

## 2017-10-13 DIAGNOSIS — M5431 Sciatica, right side: Secondary | ICD-10-CM | POA: Diagnosis not present

## 2017-10-13 MED ORDER — HYDROCODONE-ACETAMINOPHEN 5-325 MG PO TABS: 1.0000 | ORAL_TABLET | Freq: Two times a day (BID) | ORAL | 0 refills | Status: DC | PRN

## 2017-10-13 NOTE — Telephone Encounter (Signed)
Rx pending signature. Will be ready for pick up tomorrow AM.  

## 2017-10-14 ENCOUNTER — Telehealth (INDEPENDENT_AMBULATORY_CARE_PROVIDER_SITE_OTHER): Payer: Self-pay

## 2017-10-14 DIAGNOSIS — F419 Anxiety disorder, unspecified: Secondary | ICD-10-CM | POA: Diagnosis not present

## 2017-10-14 DIAGNOSIS — F900 Attention-deficit hyperactivity disorder, predominantly inattentive type: Secondary | ICD-10-CM | POA: Diagnosis not present

## 2017-10-14 MED FILL — HYDROCODON-APAP 5-325: 5-325 | 10 days supply | Qty: 20 | Fill #0

## 2017-10-14 MED FILL — traZODone HCL 50 MG TABS: 50 | 30 days supply | Qty: 30 | Fill #1

## 2017-10-14 MED FILL — PROPRANOLOL 20 MG TABLET: 20 | 30 days supply | Qty: 60 | Fill #1

## 2017-10-14 MED FILL — CITALOPRAM HBR 20 MG TABLET: 20 | 30 days supply | Qty: 30 | Fill #1

## 2017-10-14 NOTE — Telephone Encounter (Signed)
Tried to called patient no answer.  Could not leave a voicemail.  Called home number and its disconnected.   Rx ready for pick up at the front desk.

## 2017-10-15 DIAGNOSIS — M5431 Sciatica, right side: Secondary | ICD-10-CM | POA: Diagnosis not present

## 2017-10-18 DIAGNOSIS — R030 Elevated blood-pressure reading, without diagnosis of hypertension: Secondary | ICD-10-CM | POA: Diagnosis not present

## 2017-10-18 DIAGNOSIS — M545 Low back pain: Secondary | ICD-10-CM | POA: Diagnosis not present

## 2017-10-18 DIAGNOSIS — Z6828 Body mass index (BMI) 28.0-28.9, adult: Secondary | ICD-10-CM | POA: Diagnosis not present

## 2017-10-18 MED FILL — DEXTROAMP-AMPHETAMIN 15 MG: 15 | 30 days supply | Qty: 90 | Fill #0

## 2017-10-20 DIAGNOSIS — M5431 Sciatica, right side: Secondary | ICD-10-CM | POA: Diagnosis not present

## 2017-10-22 DIAGNOSIS — M5431 Sciatica, right side: Secondary | ICD-10-CM | POA: Diagnosis not present

## 2017-10-29 DIAGNOSIS — M5431 Sciatica, right side: Secondary | ICD-10-CM | POA: Diagnosis not present

## 2017-11-08 DIAGNOSIS — M5431 Sciatica, right side: Secondary | ICD-10-CM | POA: Diagnosis not present

## 2017-11-11 ENCOUNTER — Other Ambulatory Visit (INDEPENDENT_AMBULATORY_CARE_PROVIDER_SITE_OTHER): Payer: Self-pay | Admitting: Orthopaedic Surgery

## 2017-11-11 NOTE — Telephone Encounter (Signed)
Sent it to Dr Roda ShuttersXu by [facility]. Please advise.

## 2017-11-11 NOTE — Telephone Encounter (Signed)
Dr. Marikay Alaravid Jones is taking care of his back.  Defer to him on pain meds.

## 2017-11-11 NOTE — Telephone Encounter (Signed)
I cannot refill the norco, but I am happy to write a rx for tramadol if he would like

## 2017-11-11 NOTE — Telephone Encounter (Signed)
Patient would like to wait for Dr Isaac Ellis to respond.

## 2017-11-15 DIAGNOSIS — M5431 Sciatica, right side: Secondary | ICD-10-CM | POA: Diagnosis not present

## 2017-11-15 MED FILL — DEXTROAMP-AMPHETAMIN 15 MG: 15 | 30 days supply | Qty: 90 | Fill #0

## 2017-12-21 DIAGNOSIS — F419 Anxiety disorder, unspecified: Secondary | ICD-10-CM | POA: Diagnosis not present

## 2017-12-21 DIAGNOSIS — F900 Attention-deficit hyperactivity disorder, predominantly inattentive type: Secondary | ICD-10-CM | POA: Diagnosis not present

## 2017-12-21 MED FILL — traZODone HCL 50 MG TABS: 50 | 30 days supply | Qty: 30 | Fill #0

## 2017-12-21 MED FILL — hydrOXYzine HCL 25 MG TABS: 25 | 30 days supply | Qty: 60 | Fill #1

## 2017-12-23 MED FILL — DEXTROAMP-AMPHETAMIN 15 MG: 15 | 30 days supply | Qty: 90 | Fill #0

## 2018-01-21 MED FILL — DEXTROAMP-AMPHETAMIN 15 MG: 15 | 30 days supply | Qty: 90 | Fill #0

## 2018-02-09 DIAGNOSIS — F419 Anxiety disorder, unspecified: Secondary | ICD-10-CM | POA: Diagnosis not present

## 2018-02-09 DIAGNOSIS — F900 Attention-deficit hyperactivity disorder, predominantly inattentive type: Secondary | ICD-10-CM | POA: Diagnosis not present

## 2018-02-22 MED FILL — DEXTROAMP-AMPHETAMIN 15 MG: 15 | 30 days supply | Qty: 90 | Fill #0

## 2018-03-02 ENCOUNTER — Other Ambulatory Visit: Payer: Self-pay

## 2018-03-02 ENCOUNTER — Encounter: Payer: Self-pay | Admitting: Emergency Medicine

## 2018-03-02 ENCOUNTER — Ambulatory Visit (INDEPENDENT_AMBULATORY_CARE_PROVIDER_SITE_OTHER): Payer: 59 | Admitting: Emergency Medicine

## 2018-03-02 VITALS — BP 110/78 | HR 104 | Temp 98.3°F | Resp 16 | Ht 71.5 in | Wt 201.0 lb

## 2018-03-02 DIAGNOSIS — Z0000 Encounter for general adult medical examination without abnormal findings: Secondary | ICD-10-CM

## 2018-03-02 DIAGNOSIS — Z23 Encounter for immunization: Secondary | ICD-10-CM

## 2018-03-02 NOTE — Progress Notes (Signed)
Isaac Ellis 37 y.o.   Chief Complaint  Patient presents with  . Annual Exam    HISTORY OF PRESENT ILLNESS: This is a 37 y.o. male Here for annual exam; no complaints and no medical concerns.  HPI   Prior to Admission medications   Medication Sig Start Date End Date Taking? Authorizing Provider  amphetamine-dextroamphetamine (ADDERALL) 30 MG tablet Take 30 mg by mouth daily.   Yes [provider]  HYDROcodone-acetaminophen (NORCO) 5-325 MG tablet Take 1 tablet by mouth 2 (two) times daily as needed. Patient not taking: Reported on 03/02/2018 10/13/17   Tarry Kos, MD  naproxen (NAPROSYN) 500 MG tablet Take 1 tablet (500 mg total) by mouth 2 (two) times daily with a meal. Patient not taking: Reported on 03/02/2018 06/15/17   Tarry Kos, MD  omeprazole (PRILOSEC) 40 MG capsule Take 1 capsule (40 mg total) by mouth daily. 02/04/17 02/14/17  Georgina Quint, MD  predniSONE (STERAPRED UNI-PAK 21 TAB) 10 MG (21) TBPK tablet Take as directed Patient not taking: Reported on 03/02/2018 06/15/17   Tarry Kos, MD  ranitidine (ZANTAC) 300 MG tablet Take 1 tablet (300 mg total) by mouth at bedtime. 02/04/17 02/14/17  Georgina Quint, MD  tiZANidine (ZANAFLEX) 4 MG tablet Take 1 tablet (4 mg total) by mouth every 6 (six) hours as needed for muscle spasms. Patient not taking: Reported on 03/02/2018 09/17/17   Tarry Kos, MD    No Known Allergies  Patient Active Problem List   Diagnosis Date Noted  . Lumbar radiculopathy, acute 06/15/2017  . Abdominal pain, epigastric 02/04/2017  . Acute gastritis without hemorrhage 02/04/2017    Past Medical History:  Diagnosis Date  . Anxiety     Past Surgical History:  Procedure Laterality Date  . EYE SURGERY      Social History   Socioeconomic History  . Marital status: Married    Spouse name: Not on file  . Number of children: Not on file  . Years of education: Not on file  . Highest education level: Not on file    Occupational History  . Not on file  Social Needs  . Financial resource strain: Not on file  . Food insecurity:    Worry: Not on file    Inability: Not on file  . Transportation needs:    Medical: Not on file    Non-medical: Not on file  Tobacco Use  . Smoking status: Never Smoker  . Smokeless tobacco: Never Used  Substance and Sexual Activity  . Alcohol use: No  . Drug use: Not on file  . Sexual activity: Not on file  Lifestyle  . Physical activity:    Days per week: Not on file    Minutes per session: Not on file  . Stress: Not on file  Relationships  . Social connections:    Talks on phone: Not on file    Gets together: Not on file    Attends religious service: Not on file    Active member of club or organization: Not on file    Attends meetings of clubs or organizations: Not on file    Relationship status: Not on file  . Intimate partner violence:    Fear of current or ex partner: Not on file    Emotionally abused: Not on file    Physically abused: Not on file    Forced sexual activity: Not on file  Other Topics Concern  . Not on file  Social History Narrative  . Not on file    Family History  Problem Relation Age of Onset  . Cancer Mother   . Heart disease Father   . Hyperlipidemia Father   . Hypertension Father   . Cancer Sister   . Cancer Maternal Grandfather   . Stroke Paternal Grandmother   . Cancer Paternal Grandfather      Review of Systems  Constitutional: Negative.  Negative for chills, fever and weight loss.  HENT: Negative.  Negative for congestion, hearing loss and sore throat.   Eyes: Negative.  Negative for blurred vision and double vision.  Respiratory: Negative.  Negative for cough and shortness of breath.   Cardiovascular: Negative.  Negative for chest pain, palpitations and claudication.  Gastrointestinal: Negative.  Negative for abdominal pain, diarrhea, nausea and vomiting.  Genitourinary: Negative.  Negative for dysuria and  hematuria.  Musculoskeletal: Positive for back pain (chronic; herniated lumbar disc).  Skin: Negative.  Negative for rash.  Neurological: Negative.  Negative for dizziness and headaches.  Endo/Heme/Allergies: Negative.   All other systems reviewed and are negative.   Vitals:   03/02/18 0825  BP: 110/78  Pulse: (!) 104  Resp: 16  Temp: 98.3 F (36.8 C)  SpO2: 99%    Physical Exam  Constitutional: He is oriented to person, place, and time. He appears well-developed and well-nourished.  HENT:  Head: Normocephalic and atraumatic.  Right Ear: External ear normal.  Left Ear: External ear normal.  Nose: Nose normal.  Mouth/Throat: Oropharynx is clear and moist.  Eyes: Pupils are equal, round, and reactive to light. Conjunctivae and EOM are normal.  Neck: Normal range of motion. Neck supple. No JVD present. No thyromegaly present.  Cardiovascular: Normal rate, regular rhythm, normal heart sounds and intact distal pulses.  Pulmonary/Chest: Effort normal and breath sounds normal.  Abdominal: Soft. Bowel sounds are normal. He exhibits no distension. There is no tenderness.  Musculoskeletal: Normal range of motion. He exhibits no edema or tenderness.  Lymphadenopathy:    He has no cervical adenopathy.  Neurological: He is alert and oriented to person, place, and time. No sensory deficit. He exhibits normal muscle tone.  Skin: Skin is warm and dry. Capillary refill takes less than 2 seconds. No rash noted.  Psychiatric: He has a normal mood and affect. His behavior is normal.  Vitals reviewed.    ASSESSMENT & PLAN: Torence was seen today for annual exam.  Diagnoses and all orders for this visit:  Routine general medical examination at a health care facility -     CBC with Differential -     Comprehensive metabolic panel -     Hemoglobin A1c -     Lipid panel -     HIV antibody  Need for diphtheria-tetanus-pertussis (Tdap) vaccine -     Tdap vaccine greater than or equal to 7yo  IM    Patient Instructions       IF you received an x-ray today, you will receive an invoice from [facility]. Please contact [facility] at [phone] with questions or concerns regarding your invoice.   IF you received labwork today, you will receive an invoice from [facility]. Please contact [facility] at [phone] with questions or concerns regarding your invoice.   Our billing staff will not be able to assist you with questions regarding bills from these companies.  You will be contacted with the lab results as soon as they are available. The fastest way to get your results is to  activate your My Chart account. Instructions are located on the last page of this paperwork. If you have not heard from [place] regarding the results in 2 weeks, please contact this office.      Health Maintenance, Male A healthy lifestyle and preventive care is important for your health and wellness. Ask your health care provider about what schedule of regular examinations is right for you. What should I know about weight and diet? Eat a Healthy Diet  Eat plenty of vegetables, fruits, whole grains, low-fat dairy products, and lean protein.  Do not eat a lot of foods high in solid fats, added sugars, or salt.  Maintain a Healthy Weight Regular exercise can help you achieve or maintain a healthy weight. You should:  Do at least 150 minutes of exercise each week. The exercise should increase your heart rate and make you sweat (moderate-intensity exercise).  Do strength-training exercises at least twice a week.  Watch Your Levels of Cholesterol and Blood Lipids  Have your blood tested for lipids and cholesterol every 5 years starting at 38 years of age. If you are at high risk for heart disease, you should start having your blood tested when you are 39 years old. You may need to have your cholesterol levels checked more often if: ? Your lipid or cholesterol levels are high. ? You are  older than 39 years of age. ? You are at high risk for heart disease.  What should I know about cancer screening? Many types of cancers can be detected early and may often be prevented. Lung Cancer  You should be screened every year for lung cancer if: ? You are a current smoker who has smoked for at least 30 years. ? You are a former smoker who has quit within the past 15 years.  Talk to your health care provider about your screening options, when you should start screening, and how often you should be screened.  Colorectal Cancer  Routine colorectal cancer screening usually begins at 39 years of age and should be repeated every 5-10 years until you are 39 years old. You may need to be screened more often if early forms of precancerous polyps or small growths are found. Your health care provider may recommend screening at an earlier age if you have risk factors for colon cancer.  Your health care provider may recommend using home test kits to check for hidden blood in the stool.  A small camera at the end of a tube can be used to examine your colon (sigmoidoscopy or colonoscopy). This checks for the earliest forms of colorectal cancer.  Prostate and Testicular Cancer  Depending on your age and overall health, your health care provider may do certain tests to screen for prostate and testicular cancer.  Talk to your health care provider about any symptoms or concerns you have about testicular or prostate cancer.  Skin Cancer  Check your skin from head to toe regularly.  Tell your health care provider about any new moles or changes in moles, especially if: ? There is a change in a mole's size, shape, or color. ? You have a mole that is larger than a pencil eraser.  Always use sunscreen. Apply sunscreen liberally and repeat throughout the day.  Protect yourself by wearing long sleeves, pants, a wide-brimmed hat, and sunglasses when outside.  What should I know about heart disease,  diabetes, and high blood pressure?  If you are 15-22 years of age, have your blood pressure checked  every 3-5 years. If you are 61 years of age or older, have your blood pressure checked every year. You should have your blood pressure measured twice-once when you are at a hospital or clinic, and once when you are not at a hospital or clinic. Record the average of the two measurements. To check your blood pressure when you are not at a hospital or clinic, you can use: ? An automated blood pressure machine at a pharmacy. ? A home blood pressure monitor.  Talk to your health care provider about your target blood pressure.  If you are between 8-20 years old, ask your health care provider if you should take aspirin to prevent heart disease.  Have regular diabetes screenings by checking your fasting blood sugar level. ? If you are at a normal weight and have a low risk for diabetes, have this test once every three years after the age of 26. ? If you are overweight and have a high risk for diabetes, consider being tested at a younger age or more often.  A one-time screening for abdominal aortic aneurysm (AAA) by ultrasound is recommended for men aged 65-75 years who are current or former smokers. What should I know about preventing infection? Hepatitis B If you have a higher risk for hepatitis B, you should be screened for this virus. Talk with your health care provider to find out if you are at risk for hepatitis B infection. Hepatitis C Blood testing is recommended for:  Everyone born from [birth year] through 1967.  Anyone with known risk factors for hepatitis C.  Sexually Transmitted Diseases (STDs)  You should be screened each year for STDs including gonorrhea and chlamydia if: ? You are sexually active and are younger than 39 years of age. ? You are older than 39 years of age and your health care provider tells you that you are at risk for this type of infection. ? Your sexual activity has  changed since you were last screened and you are at an increased risk for chlamydia or gonorrhea. Ask your health care provider if you are at risk.  Talk with your health care provider about whether you are at high risk of being infected with HIV. Your health care provider may recommend a prescription medicine to help prevent HIV infection.  What else can I do?  Schedule regular health, dental, and eye exams.  Stay current with your vaccines (immunizations).  Do not use any tobacco products, such as cigarettes, chewing tobacco, and e-cigarettes. If you need help quitting, ask your health care provider.  Limit alcohol intake to no more than 2 drinks per day. One drink equals 12 ounces of beer, 5 ounces of wine, or 1 ounces of hard liquor.  Do not use street drugs.  Do not share needles.  Ask your health care provider for help if you need support or information about quitting drugs.  Tell your health care provider if you often feel depressed.  Tell your health care provider if you have ever been abused or do not feel safe at home. This information is not intended to replace advice given to you by your health care provider. Make sure you discuss any questions you have with your health care provider. Document Released: 03/26/2008 Document Revised: 05/27/2016 Document Reviewed: 07/02/2015 Elsevier Interactive Patient Education  2018 Elsevier Inc.      Edwina Barth, MD [facility]

## 2018-03-02 NOTE — Patient Instructions (Addendum)
   IF you received an x-ray today, you will receive an invoice from [facility]. Please contact [facility] at [phone] with questions or concerns regarding your invoice.   IF you received labwork today, you will receive an invoice from [facility]. Please contact [facility] at [phone] with questions or concerns regarding your invoice.   Our billing staff will not be able to assist you with questions regarding bills from these companies.  You will be contacted with the lab results as soon as they are available. The fastest way to get your results is to activate your My Chart account. Instructions are located on the last page of this paperwork. If you have not heard from us regarding the results in 2 weeks, please contact this office.      Health Maintenance, Male A healthy lifestyle and preventive care is important for your health and wellness. Ask your health care provider about what schedule of regular examinations is right for you. What should I know about weight and diet? Eat a Healthy Diet  Eat plenty of vegetables, fruits, whole grains, low-fat dairy products, and lean protein.  Do not eat a lot of foods high in solid fats, added sugars, or salt.  Maintain a Healthy Weight Regular exercise can help you achieve or maintain a healthy weight. You should:  Do at least 150 minutes of exercise each week. The exercise should increase your heart rate and make you sweat (moderate-intensity exercise).  Do strength-training exercises at least twice a week.  Watch Your Levels of Cholesterol and Blood Lipids  Have your blood tested for lipids and cholesterol every 5 years starting at 39 years of age. If you are at high risk for heart disease, you should start having your blood tested when you are 39 years old. You may need to have your cholesterol levels checked more often if: ? Your lipid or cholesterol levels are high. ? You are older than 39 years of age. ? You  are at high risk for heart disease.  What should I know about cancer screening? Many types of cancers can be detected early and may often be prevented. Lung Cancer  You should be screened every year for lung cancer if: ? You are a current smoker who has smoked for at least 30 years. ? You are a former smoker who has quit within the past 15 years.  Talk to your health care provider about your screening options, when you should start screening, and how often you should be screened.  Colorectal Cancer  Routine colorectal cancer screening usually begins at 39 years of age and should be repeated every 5-10 years until you are 39 years old. You may need to be screened more often if early forms of precancerous polyps or small growths are found. Your health care provider may recommend screening at an earlier age if you have risk factors for colon cancer.  Your health care provider may recommend using home test kits to check for hidden blood in the stool.  A small camera at the end of a tube can be used to examine your colon (sigmoidoscopy or colonoscopy). This checks for the earliest forms of colorectal cancer.  Prostate and Testicular Cancer  Depending on your age and overall health, your health care provider may do certain tests to screen for prostate and testicular cancer.  Talk to your health care provider about any symptoms or concerns you have about testicular or prostate cancer.  Skin Cancer  Check your skin   from head to toe regularly.  Tell your health care provider about any new moles or changes in moles, especially if: ? There is a change in a mole's size, shape, or color. ? You have a mole that is larger than a pencil eraser.  Always use sunscreen. Apply sunscreen liberally and repeat throughout the day.  Protect yourself by wearing long sleeves, pants, a wide-brimmed hat, and sunglasses when outside.  What should I know about heart disease, diabetes, and high blood  pressure?  If you are 18-39 years of age, have your blood pressure checked every 3-5 years. If you are 40 years of age or older, have your blood pressure checked every year. You should have your blood pressure measured twice-once when you are at a hospital or clinic, and once when you are not at a hospital or clinic. Record the average of the two measurements. To check your blood pressure when you are not at a hospital or clinic, you can use: ? An automated blood pressure machine at a pharmacy. ? A home blood pressure monitor.  Talk to your health care provider about your target blood pressure.  If you are between 45-79 years old, ask your health care provider if you should take aspirin to prevent heart disease.  Have regular diabetes screenings by checking your fasting blood sugar level. ? If you are at a normal weight and have a low risk for diabetes, have this test once every three years after the age of 45. ? If you are overweight and have a high risk for diabetes, consider being tested at a younger age or more often.  A one-time screening for abdominal aortic aneurysm (AAA) by ultrasound is recommended for men aged 65-75 years who are current or former smokers. What should I know about preventing infection? Hepatitis B If you have a higher risk for hepatitis B, you should be screened for this virus. Talk with your health care provider to find out if you are at risk for hepatitis B infection. Hepatitis C Blood testing is recommended for:  Everyone born from 1945 through 1965.  Anyone with known risk factors for hepatitis C.  Sexually Transmitted Diseases (STDs)  You should be screened each year for STDs including gonorrhea and chlamydia if: ? You are sexually active and are younger than 39 years of age. ? You are older than 39 years of age and your health care provider tells you that you are at risk for this type of infection. ? Your sexual activity has changed since you were last  screened and you are at an increased risk for chlamydia or gonorrhea. Ask your health care provider if you are at risk.  Talk with your health care provider about whether you are at high risk of being infected with HIV. Your health care provider may recommend a prescription medicine to help prevent HIV infection.  What else can I do?  Schedule regular health, dental, and eye exams.  Stay current with your vaccines (immunizations).  Do not use any tobacco products, such as cigarettes, chewing tobacco, and e-cigarettes. If you need help quitting, ask your health care provider.  Limit alcohol intake to no more than 2 drinks per day. One drink equals 12 ounces of beer, 5 ounces of wine, or 1 ounces of hard liquor.  Do not use street drugs.  Do not share needles.  Ask your health care provider for help if you need support or information about quitting drugs.  Tell your health care   provider if you often feel depressed.  Tell your health care provider if you have ever been abused or do not feel safe at home. This information is not intended to replace advice given to you by your health care provider. Make sure you discuss any questions you have with your health care provider. Document Released: 03/26/2008 Document Revised: 05/27/2016 Document Reviewed: 07/02/2015 Elsevier Interactive Patient Education  2018 Elsevier Inc.  

## 2018-03-03 LAB — COMPREHENSIVE METABOLIC PANEL
ALT: 44 IU/L (ref 0–44)
AST: 22 IU/L (ref 0–40)
Albumin/Globulin Ratio: 2.2 (ref 1.2–2.2)
Albumin: 5 g/dL (ref 3.5–5.5)
Alkaline Phosphatase: 94 IU/L (ref 39–117)
BUN/Creatinine Ratio: 14 (ref 9–20)
BUN: 15 mg/dL (ref 6–20)
Bilirubin Total: 0.9 mg/dL (ref 0.0–1.2)
CO2: 21 mmol/L (ref 20–29)
Calcium: 9.8 mg/dL (ref 8.7–10.2)
Chloride: 104 mmol/L (ref 96–106)
Creatinine, Ser: 1.1 mg/dL (ref 0.76–1.27)
GFR calc Af Amer: 98 mL/min/{1.73_m2} (ref >59)
GFR calc non Af Amer: 85 mL/min/{1.73_m2} (ref >59)
Globulin, Total: 2.3 g/dL (ref 1.5–4.5)
Glucose: 88 mg/dL (ref 65–99)
Potassium: 4.9 mmol/L (ref 3.5–5.2)
Sodium: 141 mmol/L (ref 134–144)
Total Protein: 7.3 g/dL (ref 6.0–8.5)

## 2018-03-03 LAB — CBC WITH DIFFERENTIAL/PLATELET
BASOS: 0 %
Basophils Absolute: 0 10*3/uL (ref 0.0–0.2)
EOS (ABSOLUTE): 0.2 10*3/uL (ref 0.0–0.4)
EOS: 3 %
HEMATOCRIT: 51.1 % — AB (ref 37.5–51.0)
HEMOGLOBIN: 17.1 g/dL (ref 13.0–17.7)
IMMATURE GRANULOCYTES: 0 %
Immature Grans (Abs): 0 10*3/uL (ref 0.0–0.1)
Lymphocytes Absolute: 2 10*3/uL (ref 0.7–3.1)
Lymphs: 37 %
MCH: 30.1 pg (ref 26.6–33.0)
MCHC: 33.5 g/dL (ref 31.5–35.7)
MCV: 90 fL (ref 79–97)
MONOCYTES: 7 %
MONOS ABS: 0.4 10*3/uL (ref 0.1–0.9)
NEUTROS PCT: 53 %
Neutrophils Absolute: 2.9 10*3/uL (ref 1.4–7.0)
Platelets: 300 10*3/uL (ref 150–450)
RBC: 5.68 x10E6/uL (ref 4.14–5.80)
RDW: 14.1 % (ref 12.3–15.4)
WBC: 5.5 10*3/uL (ref 3.4–10.8)

## 2018-03-03 LAB — LIPID PANEL
Chol/HDL Ratio: 5.4 ratio — ABNORMAL HIGH (ref 0.0–5.0)
Cholesterol, Total: 243 mg/dL — ABNORMAL HIGH (ref 100–199)
HDL: 45 mg/dL (ref >39)
LDL Calculated: 180 mg/dL — ABNORMAL HIGH (ref 0–99)
Triglycerides: 92 mg/dL (ref 0–149)
VLDL Cholesterol Cal: 18 mg/dL (ref 5–40)

## 2018-03-03 LAB — HEMOGLOBIN A1C
Est. average glucose Bld gHb Est-mCnc: 108 mg/dL
Hgb A1c MFr Bld: 5.4 % (ref 4.8–5.6)

## 2018-03-03 LAB — HIV ANTIBODY (ROUTINE TESTING W REFLEX): HIV Screen 4th Generation wRfx: NONREACTIVE

## 2018-03-04 ENCOUNTER — Encounter: Payer: Self-pay | Admitting: Emergency Medicine

## 2018-03-08 ENCOUNTER — Encounter: Payer: Self-pay | Admitting: *Deleted

## 2018-03-25 MED FILL — DEXTROAMP-AMPHETAMIN 15 MG: 15 | 30 days supply | Qty: 90 | Fill #0

## 2018-04-13 DIAGNOSIS — F900 Attention-deficit hyperactivity disorder, predominantly inattentive type: Secondary | ICD-10-CM | POA: Diagnosis not present

## 2018-04-13 DIAGNOSIS — F419 Anxiety disorder, unspecified: Secondary | ICD-10-CM | POA: Diagnosis not present

## 2018-04-22 MED FILL — DEXTROAMP-AMPHETAMIN 15 MG: 15 | 30 days supply | Qty: 90 | Fill #0

## 2018-05-23 MED FILL — DEXTROAMP-AMPHETAMIN 15 MG: 15 | 30 days supply | Qty: 90 | Fill #0

## 2018-06-20 DIAGNOSIS — F419 Anxiety disorder, unspecified: Secondary | ICD-10-CM | POA: Diagnosis not present

## 2018-06-20 DIAGNOSIS — F900 Attention-deficit hyperactivity disorder, predominantly inattentive type: Secondary | ICD-10-CM | POA: Diagnosis not present

## 2018-06-20 MED FILL — PROPRANOLOL 20 MG TABLET: 20 | 30 days supply | Qty: 60 | Fill #0

## 2018-06-20 MED FILL — hydrOXYzine HCL 10 MG TABS: 10 | 30 days supply | Qty: 60 | Fill #0

## 2018-06-20 MED FILL — AMPHETAMINE-DEXTROAMPHETAMI: 15 | 30 days supply | Qty: 90 | Fill #0

## 2018-06-20 MED FILL — CITALOPRAM HBR 20 MG TABLET: 20 | 30 days supply | Qty: 30 | Fill #0

## 2018-07-22 MED FILL — AMPHETAMINE-DEXTROAMPHETAMI: 15 | 30 days supply | Qty: 90 | Fill #0

## 2018-08-16 DIAGNOSIS — F900 Attention-deficit hyperactivity disorder, predominantly inattentive type: Secondary | ICD-10-CM | POA: Diagnosis not present

## 2018-08-16 DIAGNOSIS — F419 Anxiety disorder, unspecified: Secondary | ICD-10-CM | POA: Diagnosis not present

## 2018-08-16 MED FILL — hydrOXYzine HCL 10 MG TABS: 10 | 30 days supply | Qty: 60 | Fill #0

## 2018-08-16 MED FILL — CITALOPRAM HBR 20 MG TABLET: 20 | 30 days supply | Qty: 30 | Fill #0

## 2018-08-16 MED FILL — PROPRANOLOL 20 MG TABLET: 20 | 30 days supply | Qty: 60 | Fill #0

## 2018-08-19 MED FILL — AMPHETAMINE-DEXTROAMPHETAMI: 15 | 30 days supply | Qty: 90 | Fill #0

## 2018-09-16 MED FILL — AMPHETAMINE-DEXTROAMPHETAMI: 15 | 30 days supply | Qty: 90 | Fill #0

## 2018-10-14 ENCOUNTER — Telehealth: Payer: Self-pay | Admitting: Emergency Medicine

## 2018-10-14 NOTE — Telephone Encounter (Signed)
LVM for pt to call the office and schedule an appt per his request CRM [phone]. Dr. Latrelle Dodrill first available is not until the end of January. When pt calls back, please make an appt for pt for an OV - for referral. Thank you!

## 2018-10-18 MED FILL — CITALOPRAM HBR 20 MG TABLET: 20 | 30 days supply | Qty: 30 | Fill #0

## 2018-10-18 MED FILL — hydrOXYzine HCL 10 MG TABS: 10 | 30 days supply | Qty: 60 | Fill #0

## 2018-10-18 MED FILL — PROPRANOLOL 20 MG TABLET: 20 | 30 days supply | Qty: 60 | Fill #0

## 2018-10-18 MED FILL — AMPHETAMINE-DEXTROAMPHETAMI: 15 | 30 days supply | Qty: 90 | Fill #0

## 2018-11-15 MED FILL — AMPHETAMINE-DEXTROAMPHETAMI: 15 | 30 days supply | Qty: 90 | Fill #0

## 2018-12-08 MED FILL — PROPRANOLOL 20 MG TABLET: 20 | 30 days supply | Qty: 60 | Fill #0

## 2018-12-08 MED FILL — CITALOPRAM HBR 20 MG TABLET: 20 | 30 days supply | Qty: 30 | Fill #0

## 2018-12-08 MED FILL — hydrOXYzine HCL 10 MG TABS: 10 | 30 days supply | Qty: 60 | Fill #0

## 2018-12-16 MED FILL — AMPHETAMINE-DEXTROAMPHETAMI: 15 | 30 days supply | Qty: 90 | Fill #0

## 2019-01-02 MED FILL — CITALOPRAM HBR 20 MG TABLET: 20 | 30 days supply | Qty: 30 | Fill #1

## 2019-01-02 MED FILL — hydrOXYzine HCL 10 MG TABS: 10 | 30 days supply | Qty: 60 | Fill #1

## 2019-01-02 MED FILL — PROPRANOLOL 20 MG TABLET: 20 | 30 days supply | Qty: 60 | Fill #1

## 2019-01-13 MED FILL — AMPHETAMINE-DEXTROAMPHETAMI: 15 | 30 days supply | Qty: 90 | Fill #0

## 2019-02-07 MED FILL — CITALOPRAM HBR 20 MG TABLET: 20 | 30 days supply | Qty: 30 | Fill #0

## 2019-02-07 MED FILL — hydrOXYzine HCL 10 MG TABS: 10 | 30 days supply | Qty: 60 | Fill #0

## 2019-02-07 MED FILL — PROPRANOLOL 20 MG TABLET: 20 | 30 days supply | Qty: 60 | Fill #0

## 2019-02-13 MED FILL — AMPHETAMINE-DEXTROAMPHETAMI: 15 | 30 days supply | Qty: 90 | Fill #0

## 2019-03-14 MED FILL — AMPHETAMINE-DEXTROAMPHETAMI: 15 | 30 days supply | Qty: 90 | Fill #0

## 2019-04-04 MED FILL — CITALOPRAM HBR 20 MG TABLET: 20 | 30 days supply | Qty: 30 | Fill #0

## 2019-04-13 MED FILL — AMPHETAMINE-DEXTROAMPHETAMI: 15 | 30 days supply | Qty: 90 | Fill #0

## 2019-05-16 MED FILL — AMPHETAMINE-DEXTROAMPHETAMI: 15 | 30 days supply | Qty: 90 | Fill #0

## 2019-06-16 MED FILL — AMPHETAMINE-DEXTROAMPHETAMI: 15 | 30 days supply | Qty: 90 | Fill #0

## 2019-07-14 MED FILL — AMPHETAMINE-DEXTROAMPHETAMI: 15 | 30 days supply | Qty: 90 | Fill #0

## 2019-08-02 MED FILL — hydrOXYzine HCL 10 MG TABS: 10 | 30 days supply | Qty: 60 | Fill #0

## 2019-08-02 MED FILL — PROPRANOLOL 20 MG TABLET: 20 | 30 days supply | Qty: 60 | Fill #0

## 2019-08-02 MED FILL — CITALOPRAM HBR 20 MG TABLET: 20 | 30 days supply | Qty: 30 | Fill #0

## 2019-09-15 MED FILL — AMPHETAMINE-DEXTROAMPHETAMI: 15 | 30 days supply | Qty: 90 | Fill #0

## 2019-09-22 MED FILL — hydrOXYzine HCL 10 MG TABS: 10 | 30 days supply | Qty: 60 | Fill #0

## 2019-09-22 MED FILL — CITALOPRAM HBR 20 MG TABLET: 20 | 30 days supply | Qty: 30 | Fill #0

## 2019-09-22 MED FILL — PROPRANOLOL 20 MG TABLET: 20 | 30 days supply | Qty: 60 | Fill #0

## 2019-10-16 MED FILL — hydrOXYzine HCL 10 MG TABS: 10 | 30 days supply | Qty: 60 | Fill #1

## 2019-10-16 MED FILL — AMPHETAMINE-DEXTROAMPHETAMI: 15 | 30 days supply | Qty: 90 | Fill #0

## 2019-11-03 IMAGING — MR MR LUMBAR SPINE W/O CM
4 of 5 series · 26 of 48 positions shown · non-contrast
Comparison: None.

CLINICAL DATA: Onset of low back pain radiating into the right leg
when loading a dishwasher in May 2017.

EXAM:
MRI LUMBAR SPINE WITHOUT CONTRAST
TECHNIQUE: Multiplanar, multisequence MR imaging of the lumbar spine was
performed. No intravenous contrast was administered.

[Series 2: T2 · sagittal · 4.0mm · 0.81mm/px · 6 of 15 slices shown (1 of 2)]
[im 1/15]
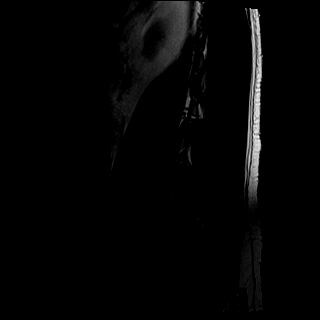
[im 3/15]
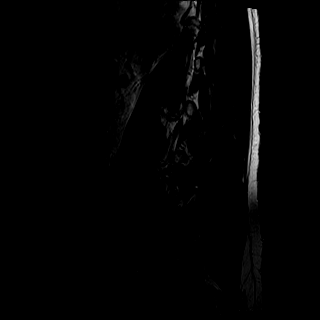
[im 6/15]
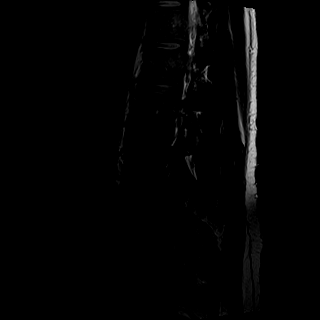
[im 9/15]
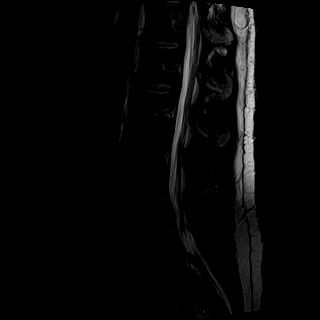
[im 12/15]
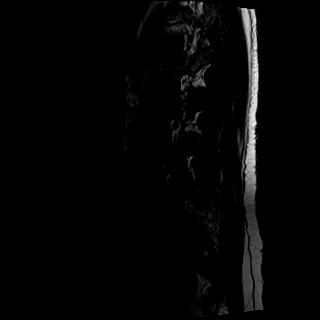
[im 15/15]
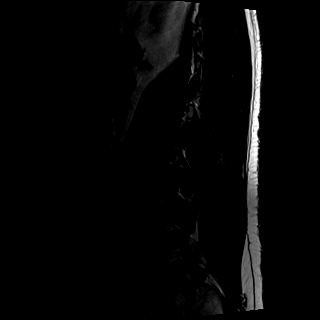

[Series 3: T1 · sagittal · 4.0mm · 0.41mm/px · 6 of 15 slices shown (1 of 2)]
[im 1/15]
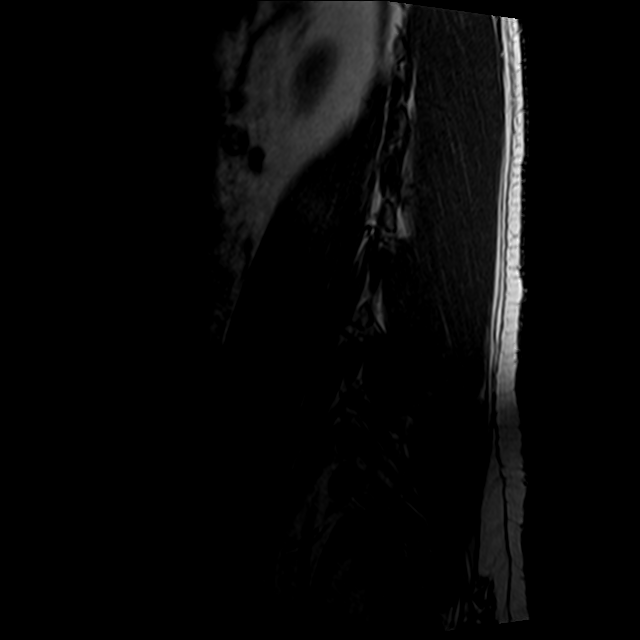
[im 3/15]
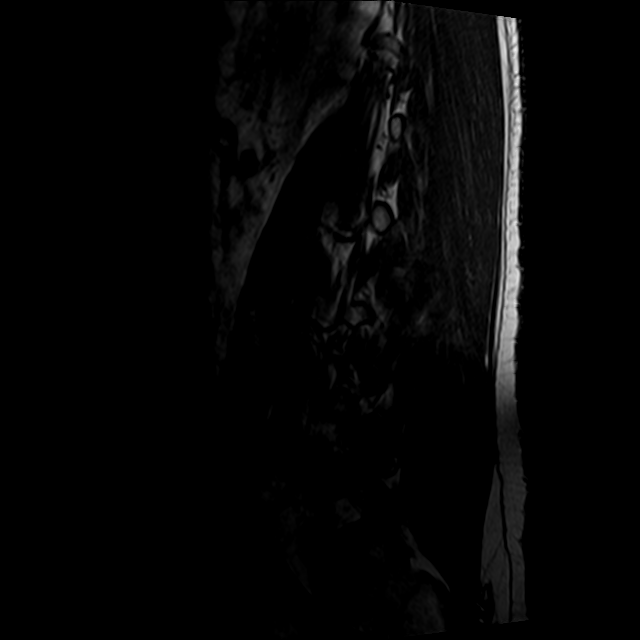
[im 6/15]
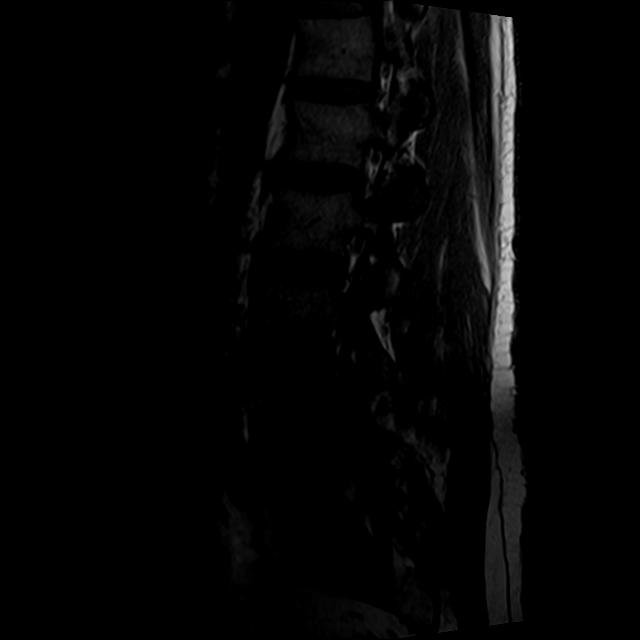
[im 9/15]
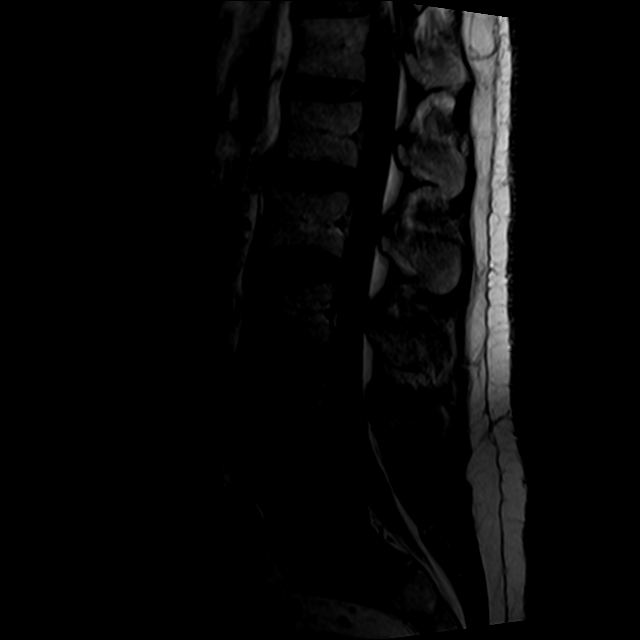
[im 12/15]
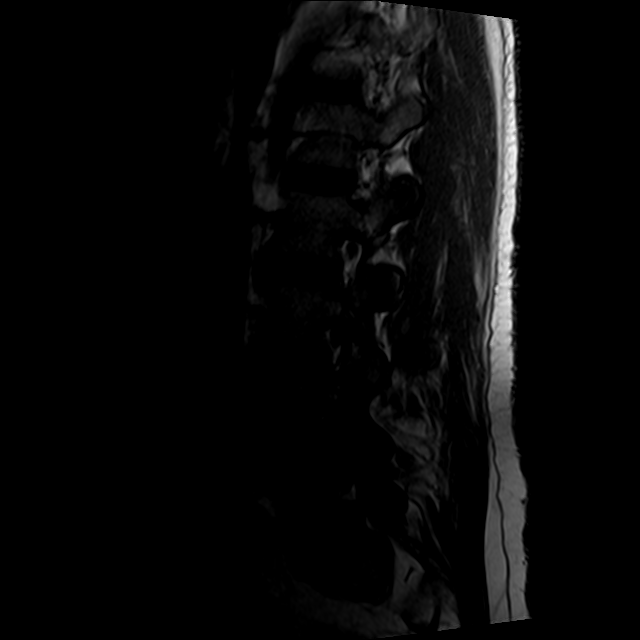
[im 15/15]
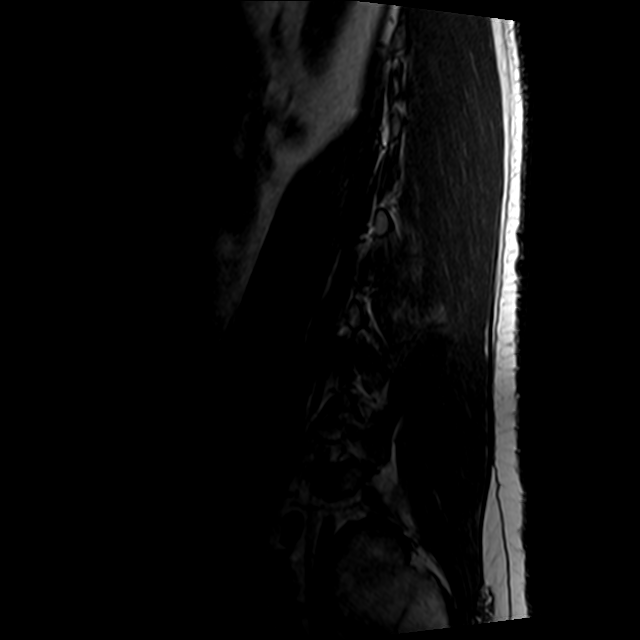

[Series 5: T2 · axial · 4.0mm · 0.78mm/px · z∈[-63,+146]mm · 9 of 38 slices shown (2 of 2)]
[im 1/38]
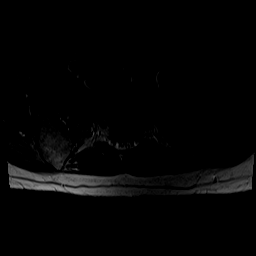
[im 6/38]
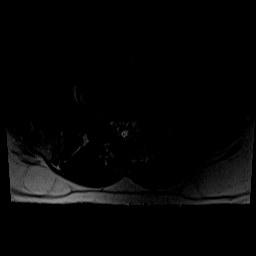
[im 11/38]
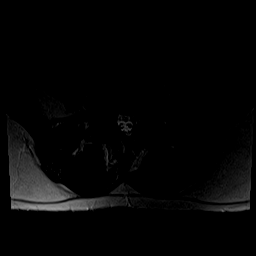
[im 16/38]
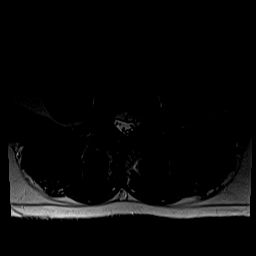
[im 19/38]
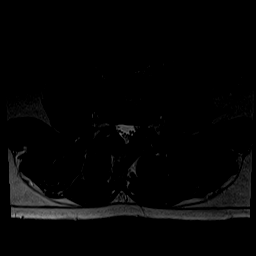
[im 22/38]
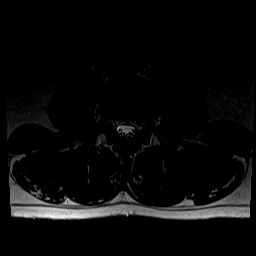
[im 27/38]
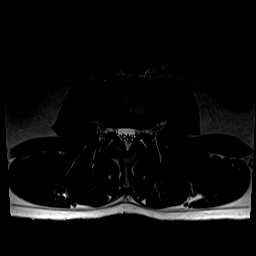
[im 32/38]
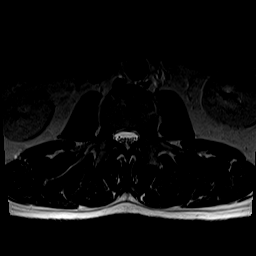
[im 38/38]
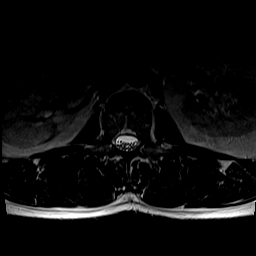

[Series 6: T1 · axial · 4.0mm · 0.39mm/px · z∈[-63,+116]mm · 5 of 38 slices shown (2 of 2)]
[im 1/38]
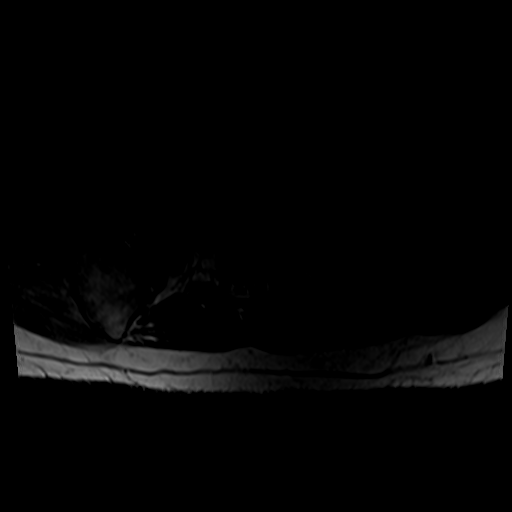
[im 6/38]
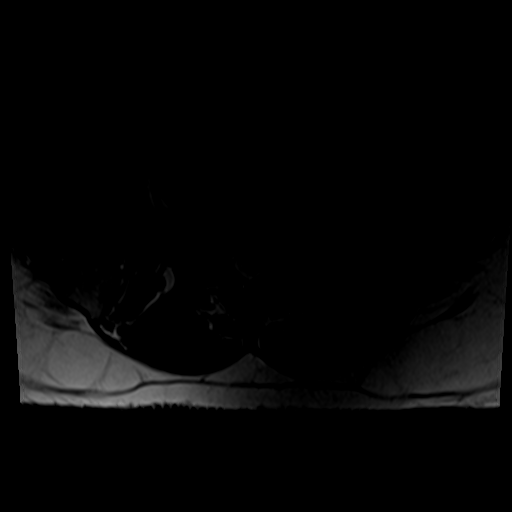
[im 11/38]
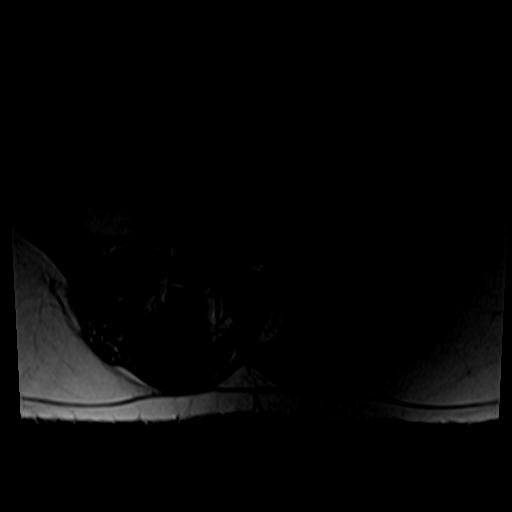
[im 19/38]
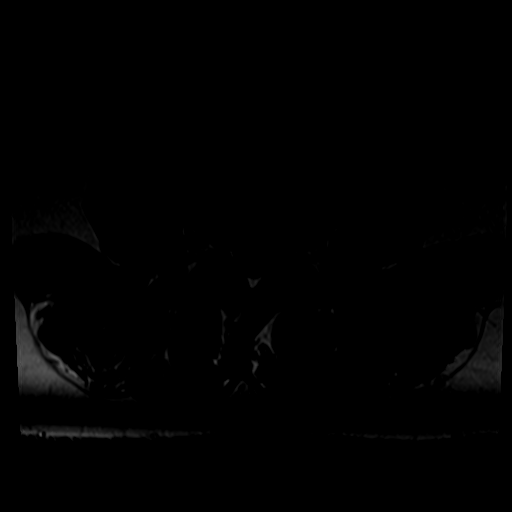
[im 32/38]
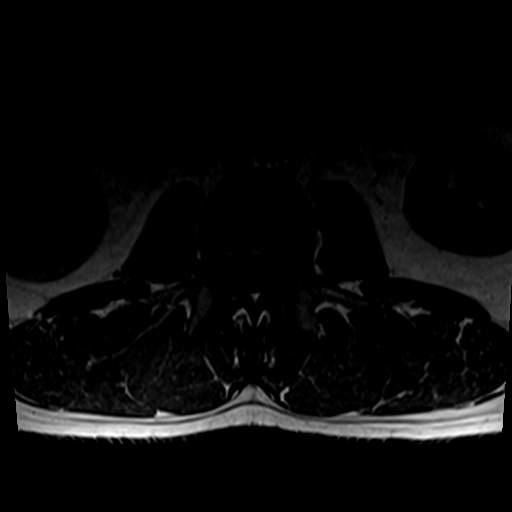

[26 of 48 positions shown; findings below may reference images not displayed]

FINDINGS: Segmentation:  Standard.

Alignment:  Maintained.

Vertebrae: No fracture or worrisome lesion. No pars interarticularis
defect.

Conus medullaris and cauda equina: Conus extends to the T12-L1
level. Conus and cauda equina appear normal.

Paraspinal and other soft tissues: Negative.

Disc levels:

T12-L1 is imaged in the sagittal plane only and negative.

L1-2:  Negative.

L2-3:  Negative.

L3-4: Minimal disc bulge without central canal or foraminal
stenosis.

L4-5: There is moderate bilateral facet degenerative disease. A
broad-based disc bulge causes moderate central canal stenosis and
narrowing in the subarticular recesses which could impact either
descending L5 root. There is also moderate to moderately severe
bilateral foraminal narrowing with encroachment on the exiting L4
roots.

L5-S1: Small annular fissure and shallow disc bulge. The central
canal and foramina remain open.
IMPRESSION: Spondylosis worst at L4-5 where a broad-based disc bulge causes
moderate central canal stenosis and narrowing of both subarticular
recesses and foramina. There is encroachment on both descending L5
and exiting L4 roots. Moderate facet degenerative change is present
at this level.

There is shallow disc bulge is seen at L5-S1 without stenosis.

## 2019-11-14 MED FILL — AMPHETAMINE-DEXTROAMPHETAMI: 15 | 30 days supply | Qty: 90 | Fill #0

## 2019-11-21 MED FILL — hydrOXYzine HCL 10 MG TABS: 10 | 60 days supply | Qty: 60 | Fill #0

## 2019-12-01 ENCOUNTER — Ambulatory Visit: Payer: Self-pay | Attending: Internal Medicine

## 2019-12-01 DIAGNOSIS — Z23 Encounter for immunization: Secondary | ICD-10-CM | POA: Insufficient documentation

## 2019-12-01 NOTE — Progress Notes (Signed)
   Covid-19 Vaccination Clinic  Name:  Isaac Ellis    MRN: [id number] DOB: 23-Jun-1981  12/01/2019  Mr. Isaac Ellis was observed post Covid-19 immunization for 15 minutes without incidence. He was provided with Vaccine Information Sheet and instruction to access the V-Safe system.   Mr. Isaac Ellis was instructed to call [phone] with any severe reactions post vaccine: Marland Kitchen Difficulty breathing  . Swelling of your face and throat  . A fast heartbeat  . A bad rash all over your body  . Dizziness and weakness    Immunizations Administered    Name Date Dose VIS Date Route   Pfizer COVID-19 Vaccine 12/01/2019  9:42 AM 0.3 mL 09/22/2019 Intramuscular   Manufacturer: [employer], [employer]   Lot: [id number]   NDC: [address]-6

## 2019-12-13 MED FILL — AMPHETAMINE-DEXTROAMPHETAMI: 15 | 30 days supply | Qty: 90 | Fill #0

## 2020-01-12 MED FILL — AMPHETAMINE-DEXTROAMPHETAMI: 15 | 30 days supply | Qty: 90 | Fill #0

## 2020-01-16 MED FILL — CITALOPRAM HBR 20 MG TABLET: 20 | 30 days supply | Qty: 30 | Fill #0

## 2020-01-16 MED FILL — hydrOXYzine HCL 10 MG TABS: 10 | 30 days supply | Qty: 60 | Fill #0

## 2020-01-16 MED FILL — PROPRANOLOL 20 MG TABLET: 20 | 30 days supply | Qty: 60 | Fill #0

## 2020-02-16 MED FILL — AMPHETAMINE-DEXTROAMPHETAMI: 15 | 30 days supply | Qty: 90 | Fill #0

## 2020-03-12 MED FILL — hydrOXYzine HCL 10 MG TABS: 10 | 30 days supply | Qty: 60 | Fill #0

## 2020-03-12 MED FILL — PROPRANOLOL 20 MG TABLET: 20 | 30 days supply | Qty: 60 | Fill #0

## 2020-03-12 MED FILL — CITALOPRAM HBR 20 MG TABLET: 20 | 30 days supply | Qty: 30 | Fill #0

## 2020-03-25 ENCOUNTER — Encounter: Payer: Self-pay | Admitting: Family Medicine

## 2020-03-25 ENCOUNTER — Other Ambulatory Visit: Payer: Self-pay

## 2020-03-25 ENCOUNTER — Ambulatory Visit (INDEPENDENT_AMBULATORY_CARE_PROVIDER_SITE_OTHER): Payer: No Typology Code available for payment source | Admitting: Family Medicine

## 2020-03-25 VITALS — BP 116/80 | HR 98 | Temp 98.1°F | Ht 71.0 in | Wt 220.0 lb

## 2020-03-25 DIAGNOSIS — I10 Essential (primary) hypertension: Secondary | ICD-10-CM | POA: Diagnosis not present

## 2020-03-25 DIAGNOSIS — Z8249 Family history of ischemic heart disease and other diseases of the circulatory system: Secondary | ICD-10-CM

## 2020-03-25 DIAGNOSIS — E785 Hyperlipidemia, unspecified: Secondary | ICD-10-CM

## 2020-03-25 DIAGNOSIS — Z0001 Encounter for general adult medical examination with abnormal findings: Secondary | ICD-10-CM | POA: Diagnosis not present

## 2020-03-25 DIAGNOSIS — Z131 Encounter for screening for diabetes mellitus: Secondary | ICD-10-CM

## 2020-03-25 DIAGNOSIS — Z0000 Encounter for general adult medical examination without abnormal findings: Secondary | ICD-10-CM

## 2020-03-25 MED ORDER — ATORVASTATIN CALCIUM 10 MG PO TABS: 10.0000 mg | ORAL_TABLET | Freq: Every day | ORAL | 0 refills | Status: DC

## 2020-03-25 MED FILL — ATORVASTATIN CALCIUM 10 MG: 10 | 90 days supply | Qty: 90 | Fill #0

## 2020-03-25 NOTE — Patient Instructions (Addendum)
I do recommend statin medicine for cholesterol if still elevated based on your dad's history. Can initially start a few days per week to see if tolerated. Keep up the good work with diet/exercise. Recheck in 6 weeks from starting cholesterol med.   Let me know if there are other questions.   Keeping you healthy  Get these tests  Blood pressure- Have your blood pressure checked once a year by your healthcare provider.  Normal blood pressure is 120/80.  Weight- Have your body mass index (BMI) calculated to screen for obesity.  BMI is a measure of body fat based on height and weight. You can also calculate your own BMI at [employer]velB[employer].it.  Cholesterol- Have your cholesterol checked regularly starting at age 41, sooner may be necessary if you have diabetes, high blood pressure, if a family member developed heart diseases at an early age or if you smoke.   Chlamydia, HIV, and other sexual transmitted disease- Get screened each year until the age of 41 then within three months of each new sexual partner.  Diabetes- Have your blood sugar checked regularly if you have high blood pressure, high cholesterol, a family history of diabetes or if you are overweight.  Get these vaccines  Flu shot- Every fall.  Tetanus shot- Every 10 years.  Menactra- Single dose; prevents meningitis.  Take these steps  Don't smoke- If you do smoke, ask your healthcare provider about quitting. For tips on how to quit, go to www.smokefree.gov or call 1-800-QUIT-NOW.  Be physically active- Exercise 5 days a week for at least 30 minutes.  If you are not already physically active start slow and gradually work up to 30 minutes of moderate physical activity.  Examples of moderate activity include walking briskly, mowing the yard, dancing, swimming bicycling, etc.  Eat a healthy diet- Eat a variety of healthy foods such as fruits, vegetables, low fat milk, low fat cheese, yogurt, lean meats, poultry, fish,  beans, tofu, etc.  For more information on healthy eating, go to www.thenutritionsource.org  Drink alcohol in moderation- Limit alcohol intake two drinks or less a day.  Never drink and drive.  Dentist- Brush and floss teeth twice daily; visit your dentis twice a year.  Depression-Your emotional health is as important as your physical health.  If you're feeling down, losing interest in things you normally enjoy please talk with your healthcare provider.  Gun Safety- If you keep a gun in your home, keep it unloaded and with the safety lock on.  Bullets should be stored separately.  Helmet use- Always wear a helmet when riding a motorcycle, bicycle, rollerblading or skateboarding.  Safe sex- If you may be exposed to a sexually transmitted infection, use a condom  Seat belts- Seat bels can save your life; always wear one.  Smoke/Carbon Monoxide detectors- These detectors need to be installed on the appropriate level of your home.  Replace batteries at least once a year.  Skin Cancer- When out in the sun, cover up and use sunscreen SPF 15 or higher.  Violence- If anyone is threatening or hurting you, please tell your healthcare provider.   If you have lab work done today you will be contacted with your lab results within the next 2 weeks.  If you have not heard from [place] then please contact us. The fastest way to get your results is to register for My Chart.   IF you received an x-ray today, you will receive an invoice from [facility]. Please contact [facility]  [facility] at [phone] with questions or concerns regarding your invoice.   IF you received labwork today, you will receive an invoice from [place]. Please contact [facility] at [phone] with questions or concerns regarding your invoice.   Our billing staff will not be able to assist you with questions regarding bills from these companies.  You will be contacted with the lab results as soon as they are available. The  fastest way to get your results is to activate your My Chart account. Instructions are located on the last page of this paperwork. If you have not heard from [place] regarding the results in 2 weeks, please contact this office.

## 2020-03-25 NOTE — Progress Notes (Signed)
Subjective:  Patient ID: Isaac Ellis, male    DOB: August 15, 1981  Age: 39 y.o. MRN: [id number]  CC:  Chief Complaint  Patient presents with  . Annual Exam    pt reports he feels great with no complaints. pt takes his medications as prescribed with no known side effects.    HPI Isaac Ellis presents for  Annual physical exam.  Followed by Dr. Jannifer Franklin, psychiatry for attention deficit hyperactivity disorder and anxiety.  adderall 3omg qd.  Hydroxyzine as needed for sleep 1-2 per week. Rare propanolol for situational anxiety.   Sport and exercise psychologist. Rare alcohol - 1 in 6 months.   Hyperlipidemia: No current meds.  More exercise since last visit.  beachbody workouts 250 days over past year.  Running few days per week.  Muscle mass has increased with weight up some.  Rare soda/sweet tea. No fast food, some restaurant food. Has tried shakes at lunch as meal replacement, or mid morning. Snack in afternoon.  Plans on weight watchers.  FH - CAD - father with quintuple bypass at 56 (he was a smoker),  Paternal uncles with MI.    Wt Readings from Last 3 Encounters:  03/25/20 220 lb (99.8 kg)  03/02/18 201 lb (91.2 kg)  02/04/17 198 lb 12.8 oz (90.2 kg)    The 10-year ASCVD risk score Denman George DC Jr., et al., 2013) is: 1.6%   Values used to calculate the score:     Age: 53 years     Sex: Male     Is Non-Hispanic African American: No     Diabetic: No     Tobacco smoker: No     Systolic Blood Pressure: 116 mmHg     Is BP treated: No     HDL Cholesterol: 45 mg/dL     Total Cholesterol: 243 mg/dL  Lab Results  Component Value Date   CHOL 243 (H) 03/02/2018   HDL 45 03/02/2018   LDLCALC 180 (H) 03/02/2018   TRIG 92 03/02/2018   CHOLHDL 5.4 (H) 03/02/2018   Lab Results  Component Value Date   ALT 44 03/02/2018   AST 22 03/02/2018   ALKPHOS 94 03/02/2018   BILITOT 0.9 03/02/2018    Depression screen PHQ 2/9 03/25/2020 03/02/2018 02/04/2017  Decreased Interest 0 0 0   Down, Depressed, Hopeless 0 0 0  PHQ - 2 Score 0 0 0   FH: Breast CA in sister - deceased, mom with breast ca.    Has dentist - every 6 months.   Exercise most days per week.   Declines STI testing.    History Patient Active Problem List   Diagnosis Date Noted  . Lumbar radiculopathy, acute 06/15/2017  . Abdominal pain, epigastric 02/04/2017  . Acute gastritis without hemorrhage 02/04/2017   Past Medical History:  Diagnosis Date  . Anxiety   . Muscle disorder    sciatica   Past Surgical History:  Procedure Laterality Date  . EYE SURGERY     Lasik   No Known Allergies Prior to Admission medications   Medication Sig Start Date End Date Taking? Authorizing Provider  amphetamine-dextroamphetamine (ADDERALL) 30 MG tablet Take 30 mg by mouth daily.   Yes [provider]   Social History   Socioeconomic History  . Marital status: Married    Spouse name: Not on file  . Number of children: Not on file  . Years of education: Not on file  . Highest education level: Not on file  Occupational History  .  Occupation: Sport and exercise psychologist  Tobacco Use  . Smoking status: Never Smoker  . Smokeless tobacco: Never Used  Vaping Use  . Vaping Use: Never used  Substance and Sexual Activity  . Alcohol use: Yes    Alcohol/week: 1.0 standard drink    Types: 1 Cans of beer per week    Comment: 1 beer a month  . Drug use: Never  . Sexual activity: Yes  Other Topics Concern  . Not on file  Social History Narrative  . Not on file   Social Determinants of Health   Financial Resource Strain:   . Difficulty of Paying Living Expenses:   Food Insecurity:   . Worried About Programme researcher, broadcasting/film/video in the Last Year:   . Barista in the Last Year:   Transportation Needs:   . Freight forwarder (Medical):   Marland Kitchen Lack of Transportation (Non-Medical):   Physical Activity:   . Days of Exercise per Week:   . Minutes of Exercise per Session:   Stress:   . Feeling of Stress  :   Social Connections:   . Frequency of Communication with Friends and Family:   . Frequency of Social Gatherings with Friends and Family:   . Attends Religious Services:   . Active Member of Clubs or Organizations:   . Attends Banker Meetings:   Marland Kitchen Marital Status:   Intimate Partner Violence:   . Fear of Current or Ex-Partner:   . Emotionally Abused:   Marland Kitchen Physically Abused:   . Sexually Abused:     Review of Systems  13 point review of systems per patient health survey noted.  Negative other than as indicated above or in HPI.   Objective:   Vitals:   03/25/20 1032  BP: 116/80  Pulse: 98  Temp: 98.1 F (36.7 C)  TempSrc: Temporal  SpO2: 96%  Weight: 220 lb (99.8 kg)  Height: 5\' 11"  (1.803 m)     Physical Exam Vitals reviewed.  Constitutional:      Appearance: He is well-developed.  HENT:     Head: Normocephalic and atraumatic.     Right Ear: External ear normal.     Left Ear: External ear normal.  Eyes:     Conjunctiva/sclera: Conjunctivae normal.     Pupils: Pupils are equal, round, and reactive to light.  Neck:     Thyroid: No thyromegaly.  Cardiovascular:     Rate and Rhythm: Normal rate and regular rhythm.     Heart sounds: Normal heart sounds.  Pulmonary:     Effort: Pulmonary effort is normal. No respiratory distress.     Breath sounds: Normal breath sounds. No wheezing.  Abdominal:     General: There is no distension.     Palpations: Abdomen is soft.     Tenderness: There is no abdominal tenderness.  Musculoskeletal:        General: No tenderness. Normal range of motion.     Cervical back: Normal range of motion and neck supple.  Lymphadenopathy:     Cervical: No cervical adenopathy.  Skin:    General: Skin is warm and dry.  Neurological:     Mental Status: He is alert and oriented to person, place, and time.     Deep Tendon Reflexes: Reflexes are normal and symmetric.  Psychiatric:        Behavior: Behavior normal.      Assessment & Plan:  Isaac Ellis is a 39 y.o. male .  Annual physical exam  Essential hypertension - Plan: Comprehensive metabolic panel, Lipid panel  Hyperlipidemia, unspecified hyperlipidemia type - Plan: Comprehensive metabolic panel, Lipid panel, atorvastatin (LIPITOR) 10 MG tablet  Screening for diabetes mellitus (DM) - Plan: Hemoglobin A1c  Family history of early CAD  -anticipatory guidance as below in AVS, screening labs above. Health maintenance items as above in HPI discussed/recommended as applicable.   Continue to follow-up with psychiatry regarding anxiety, ADHD  Repeat lipids, but with family history of early CAD, would recommend starting low-dose statin, Lipitor 10 mg printed depending on repeat labs.  Commended on his increased exercise.  Potential side effects discussed, recheck 6 weeks from start of statin.  No orders of the defined types were placed in this encounter.  Patient Instructions       If you have lab work done today you will be contacted with your lab results within the next 2 weeks.  If you have not heard from [place] then please contact us. The fastest way to get your results is to register for My Chart.   IF you received an x-ray today, you will receive an invoice from [facility]. Please contact [facility] at [phone] with questions or concerns regarding your invoice.   IF you received labwork today, you will receive an invoice from [place]. Please contact [facility] at [phone] with questions or concerns regarding your invoice.   Our billing staff will not be able to assist you with questions regarding bills from these companies.  You will be contacted with the lab results as soon as they are available. The fastest way to get your results is to activate your My Chart account. Instructions are located on the last page of this paperwork. If you have not heard from [place] regarding the results in 2 weeks, please contact  this office.         Signed, Merri Ray, MD [facility]

## 2020-03-26 LAB — COMPREHENSIVE METABOLIC PANEL
ALT: 71 IU/L — ABNORMAL HIGH (ref 0–44)
AST: 28 IU/L (ref 0–40)
Albumin/Globulin Ratio: 2 (ref 1.2–2.2)
Albumin: 4.8 g/dL (ref 4.0–5.0)
Alkaline Phosphatase: 98 IU/L (ref 48–121)
BUN/Creatinine Ratio: 15 (ref 9–20)
BUN: 17 mg/dL (ref 6–24)
Bilirubin Total: 0.9 mg/dL (ref 0.0–1.2)
CO2: 23 mmol/L (ref 20–29)
Calcium: 9.9 mg/dL (ref 8.7–10.2)
Chloride: 102 mmol/L (ref 96–106)
Creatinine, Ser: 1.13 mg/dL (ref 0.76–1.27)
GFR calc Af Amer: 93 mL/min/{1.73_m2} (ref >59)
GFR calc non Af Amer: 81 mL/min/{1.73_m2} (ref >59)
Globulin, Total: 2.4 g/dL (ref 1.5–4.5)
Glucose: 96 mg/dL (ref 65–99)
Potassium: 5.3 mmol/L — ABNORMAL HIGH (ref 3.5–5.2)
Sodium: 138 mmol/L (ref 134–144)
Total Protein: 7.2 g/dL (ref 6.0–8.5)

## 2020-03-26 LAB — LIPID PANEL
Chol/HDL Ratio: 7.1 ratio — ABNORMAL HIGH (ref 0.0–5.0)
Cholesterol, Total: 282 mg/dL — ABNORMAL HIGH (ref 100–199)
HDL: 40 mg/dL (ref >39)
LDL Chol Calc (NIH): 217 mg/dL — ABNORMAL HIGH (ref 0–99)
Triglycerides: 137 mg/dL (ref 0–149)
VLDL Cholesterol Cal: 25 mg/dL (ref 5–40)

## 2020-03-26 LAB — HEMOGLOBIN A1C
Est. average glucose Bld gHb Est-mCnc: 108 mg/dL
Hgb A1c MFr Bld: 5.4 % (ref 4.8–5.6)

## 2020-04-19 MED FILL — AMPHETAMINE-DEXTROAMPHETAMI: 15 | 30 days supply | Qty: 90 | Fill #0

## 2020-04-29 ENCOUNTER — Other Ambulatory Visit: Payer: Self-pay

## 2020-04-29 ENCOUNTER — Ambulatory Visit (INDEPENDENT_AMBULATORY_CARE_PROVIDER_SITE_OTHER): Payer: No Typology Code available for payment source

## 2020-04-29 ENCOUNTER — Ambulatory Visit (INDEPENDENT_AMBULATORY_CARE_PROVIDER_SITE_OTHER): Payer: No Typology Code available for payment source | Admitting: Emergency Medicine

## 2020-04-29 ENCOUNTER — Encounter: Payer: Self-pay | Admitting: Emergency Medicine

## 2020-04-29 VITALS — BP 110/78 | HR 108 | Temp 97.4°F | Resp 16 | Ht 71.75 in | Wt 213.0 lb

## 2020-04-29 DIAGNOSIS — R079 Chest pain, unspecified: Secondary | ICD-10-CM | POA: Diagnosis not present

## 2020-04-29 LAB — POCT CBC
Granulocyte percent: 62.5 %G (ref 37–80)
HCT, POC: 48.8 % — AB (ref 29–41)
Hemoglobin: 13.2 g/dL (ref 11–14.6)
Lymph, poc: 2.4 (ref 0.6–3.4)
MCH, POC: 30.5 pg (ref 27–31.2)
MCHC: 33.1 g/dL (ref 31.8–35.4)
MCV: 91.9 fL (ref 76–111)
MID (cbc): 0.8 (ref 0–0.9)
MPV: 7.5 fL (ref 0–99.8)
POC Granulocyte: 5.2 (ref 2–6.9)
POC LYMPH PERCENT: 28 %L (ref 10–50)
POC MID %: 9.5 %M (ref 0–12)
Platelet Count, POC: 282 10*3/uL (ref 142–424)
RBC: 5.31 M/uL (ref 4.69–6.13)
RDW, POC: 13.3 %
WBC: 8.4 10*3/uL (ref 4.6–10.2)

## 2020-04-29 NOTE — Patient Instructions (Addendum)
   If you have lab work done today you will be contacted with your lab results within the next 2 weeks.  If you have not heard from us then please contact us. The fastest way to get your results is to register for My Chart.   IF you received an x-ray today, you will receive an invoice from [facility]. Please contact [facility] at [phone] with questions or concerns regarding your invoice.   IF you received labwork today, you will receive an invoice from [facility]. Please contact [facility] at [phone] with questions or concerns regarding your invoice.   Our billing staff will not be able to assist you with questions regarding bills from these companies.  You will be contacted with the lab results as soon as they are available. The fastest way to get your results is to activate your My Chart account. Instructions are located on the last page of this paperwork. If you have not heard from us regarding the results in 2 weeks, please contact this office.     Nonspecific Chest Pain Chest pain can be caused by many different conditions. Some causes of chest pain can be life-threatening. These will require treatment right away. Serious causes of chest pain include:  Heart attack.  A tear in the body's main blood vessel.  Redness and swelling (inflammation) around your heart.  Blood clot in your lungs. Other causes of chest pain may not be so serious. These include:  Heartburn.  Anxiety or stress.  Damage to bones or muscles in your chest.  Lung infections. Chest pain can feel like:  Pain or discomfort in your chest.  Crushing, pressure, aching, or squeezing pain.  Burning or tingling.  Dull or sharp pain that is worse when you move, cough, or take a deep breath.  Pain or discomfort that is also felt in your back, neck, jaw, shoulder, or arm, or pain that spreads to any of these areas. It is hard to know whether your pain is caused by something that is  serious or something that is not so serious. So it is important to see your doctor right away if you have chest pain. Follow these instructions at home: Medicines  Take over-the-counter and prescription medicines only as told by your doctor.  If you were prescribed an antibiotic medicine, take it as told by your doctor. Do not stop taking the antibiotic even if you start to feel better. Lifestyle   Rest as told by your doctor.  Do not use any products that contain nicotine or tobacco, such as cigarettes, e-cigarettes, and chewing tobacco. If you need help quitting, ask your doctor.  Do not drink alcohol.  Make lifestyle changes as told by your doctor. These may include: ? Getting regular exercise. Ask your doctor what activities are safe for you. ? Eating a heart-healthy diet. A diet and nutrition specialist (dietitian) can help you to learn healthy eating options. ? Staying at a healthy weight. ? Treating diabetes or high blood pressure, if needed. ? Lowering your stress. Activities such as yoga and relaxation techniques can help. General instructions  Pay attention to any changes in your symptoms. Tell your doctor about them or any new symptoms.  Avoid any activities that cause chest pain.  Keep all follow-up visits as told by your doctor. This is important. You may need more testing if your chest pain does not go away. Contact a doctor if:  Your chest pain does not go away.  You feel   depressed.  You have a fever. Get help right away if:  Your chest pain is worse.  You have a cough that gets worse, or you cough up blood.  You have very bad (severe) pain in your belly (abdomen).  You pass out (faint).  You have either of these for no clear reason: ? Sudden chest discomfort. ? Sudden discomfort in your arms, back, neck, or jaw.  You have shortness of breath at any time.  You suddenly start to sweat, or your skin gets clammy.  You feel sick to your stomach  (nauseous).  You throw up (vomit).  You suddenly feel lightheaded or dizzy.  You feel very weak or tired.  Your heart starts to beat fast, or it feels like it is skipping beats. These symptoms may be an emergency. Do not wait to see if the symptoms will go away. Get medical help right away. Call your local emergency services (911 in the [place] Do not drive yourself to the hospital. Summary  Chest pain can be caused by many different conditions. The cause may be serious and need treatment right away. If you have chest pain, see your doctor right away.  Follow your doctor's instructions for taking medicines and making lifestyle changes.  Keep all follow-up visits as told by your doctor. This includes visits for any further testing if your chest pain does not go away.  Be sure to know the signs that show that your condition has become worse. Get help right away if you have these symptoms. This information is not intended to replace advice given to you by your health care provider. Make sure you discuss any questions you have with your health care provider. Document Revised: 03/31/2018 Document Reviewed: 03/31/2018 Elsevier Patient Education  2020 [employer]  

## 2020-04-29 NOTE — Progress Notes (Signed)
Isaac Ellis 39 y.o.   Chief Complaint  Patient presents with  . Chest Pain    per patient started Sunday morning RIGHT side of chest and abd area, hard time taking a deep breath    HISTORY OF PRESENT ILLNESS: This is a 39 y.o. male complaining of right-sided chest pain that started yesterday early morning without any other significant associated symptoms.  Pain seems to be pleuritic in nature.  Denies trouble breathing, fever, chills, nausea, vomiting or any other symptoms.  HPI   Prior to Admission medications   Medication Sig Start Date End Date Taking? Authorizing Provider  amphetamine-dextroamphetamine (ADDERALL) 30 MG tablet Take 30 mg by mouth daily.   Yes [provider]  atorvastatin (LIPITOR) 10 MG tablet Take 1 tablet (10 mg total) by mouth daily. 03/25/20  Yes Shade Flood, MD    No Known Allergies  There are no problems to display for this patient.   Past Medical History:  Diagnosis Date  . Anxiety   . Muscle disorder    sciatica    Past Surgical History:  Procedure Laterality Date  . EYE SURGERY     Lasik    Social History   Socioeconomic History  . Marital status: Married    Spouse name: Not on file  . Number of children: Not on file  . Years of education: Not on file  . Highest education level: Not on file  Occupational History  . Occupation: Sport and exercise psychologist  Tobacco Use  . Smoking status: Never Smoker  . Smokeless tobacco: Never Used  Vaping Use  . Vaping Use: Never used  Substance and Sexual Activity  . Alcohol use: Yes    Alcohol/week: 1.0 standard drink    Types: 1 Cans of beer per week    Comment: 1 beer a month  . Drug use: Never  . Sexual activity: Yes  Other Topics Concern  . Not on file  Social History Narrative  . Not on file   Social Determinants of Health   Financial Resource Strain:   . Difficulty of Paying Living Expenses:   Food Insecurity:   . Worried About Programme researcher, broadcasting/film/video in the Last  Year:   . Barista in the Last Year:   Transportation Needs:   . Freight forwarder (Medical):   Marland Kitchen Lack of Transportation (Non-Medical):   Physical Activity:   . Days of Exercise per Week:   . Minutes of Exercise per Session:   Stress:   . Feeling of Stress :   Social Connections:   . Frequency of Communication with Friends and Family:   . Frequency of Social Gatherings with Friends and Family:   . Attends Religious Services:   . Active Member of Clubs or Organizations:   . Attends Banker Meetings:   Marland Kitchen Marital Status:   Intimate Partner Violence:   . Fear of Current or Ex-Partner:   . Emotionally Abused:   Marland Kitchen Physically Abused:   . Sexually Abused:     Family History  Problem Relation Age of Onset  . Cancer Mother   . Mental illness Mother   . Heart disease Father   . Hyperlipidemia Father   . Hypertension Father   . Cancer Sister        breast  . Cancer Maternal Grandfather        lung  . Stroke Paternal Grandmother   . Cancer Paternal Grandfather  lung     Review of Systems  Constitutional: Negative.  Negative for chills and fever.  HENT: Negative.  Negative for congestion and sore throat.   Respiratory: Negative.  Negative for cough, hemoptysis, sputum production, shortness of breath and wheezing.   Cardiovascular: Positive for chest pain. Negative for palpitations and leg swelling.  Gastrointestinal: Negative.  Negative for abdominal pain, diarrhea, nausea and vomiting.  Genitourinary: Negative.  Negative for dysuria and hematuria.  Musculoskeletal: Negative.  Negative for back pain, myalgias and neck pain.  Skin: Negative.  Negative for rash.  Neurological: Negative.  Negative for dizziness and headaches.  All other systems reviewed and are negative.   Vitals:   04/29/20 1516  BP: 110/78  Pulse: (!) 108  Resp: 16  Temp: (!) 97.4 F (36.3 C)  SpO2: 97%    Physical Exam Vitals reviewed.  Constitutional:       Appearance: Normal appearance.  HENT:     Head: Normocephalic.     Mouth/Throat:     Mouth: Mucous membranes are moist.     Pharynx: Oropharynx is clear.  Eyes:     Extraocular Movements: Extraocular movements intact.     Conjunctiva/sclera: Conjunctivae normal.     Pupils: Pupils are equal, round, and reactive to light.  Cardiovascular:     Rate and Rhythm: Normal rate and regular rhythm.     Pulses: Normal pulses.     Heart sounds: Normal heart sounds.  Pulmonary:     Effort: Pulmonary effort is normal.     Breath sounds: Normal breath sounds.  Musculoskeletal:     Cervical back: Normal range of motion and neck supple.  Neurological:     Mental Status: He is alert.   DG Chest 2 View  Result Date: 04/29/2020 CLINICAL DATA:  Right-sided chest pain EXAM: CHEST - 2 VIEW COMPARISON:  02/04/2017 FINDINGS: The heart size and mediastinal contours are within normal limits. Both lungs are clear. The visualized skeletal structures are unremarkable. IMPRESSION: No active cardiopulmonary disease. Electronically Signed   By: Gaylyn Rong M.D.   On: 04/29/2020 16:20   Results for orders placed or performed in visit on 04/29/20 (from the past 24 hour(s))  POCT CBC     Status: Abnormal   Collection Time: 04/29/20  4:08 PM  Result Value Ref Range   WBC 8.4 4.6 - 10.2 K/uL   Lymph, poc 2.4 0.6 - 3.4   POC LYMPH PERCENT 28.0 10 - 50 %L   MID (cbc) 0.8 0 - 0.9   POC MID % 9.5 0 - 12 %M   POC Granulocyte 5.2 2 - 6.9   Granulocyte percent 62.5 37 - 80 %G   RBC 5.31 4.69 - 6.13 M/uL   Hemoglobin 13.2 11 - 14.6 g/dL   HCT, POC 84.6 (A) 29 - 41 %   MCV 91.9 76 - 111 fL   MCH, POC 30.5 27 - 31.2 pg   MCHC 33.1 31.8 - 35.4 g/dL   RDW, POC 65.9 %   Platelet Count, POC 282 142 - 424 K/uL   MPV 7.5 0 - 99.8 fL  EKG: Normal sinus rhythm with ventricular rate of 104, no acute ischemic changes.  Early repolarization changes.  A total of 30 minutes was spent with the patient, greater than 50% of  which was in counseling/coordination of care regarding differential diagnosis, review of most recent office visit notes, review of most recent blood work results including today's CBC, review of today's chest x-ray, review  of today's EKG, ED precautions, review of all medications, prognosis and need for follow-up if no better or worse in the next several days.   ASSESSMENT & PLAN: Isaac Ellis was seen today for chest pain.  Diagnoses and all orders for this visit:  Chest pain, unspecified type -     EKG 12-Lead -     POCT CBC -     Comprehensive metabolic panel -     DG Chest 2 View    Patient Instructions       If you have lab work done today you will be contacted with your lab results within the next 2 weeks.  If you have not heard from us then please contact us. The fastest way to get your results is to register for My Chart.   IF you received an x-ray today, you will receive an invoice from [facility]. Please contact [facility] at [phone] with questions or concerns regarding your invoice.   IF you received labwork today, you will receive an invoice from [facility]. Please contact [facility] at [phone] with questions or concerns regarding your invoice.   Our billing staff will not be able to assist you with questions regarding bills from these companies.  You will be contacted with the lab results as soon as they are available. The fastest way to get your results is to activate your My Chart account. Instructions are located on the last page of this paperwork. If you have not heard from us regarding the results in 2 weeks, please contact this office.     Nonspecific Chest Pain Chest pain can be caused by many different conditions. Some causes of chest pain can be life-threatening. These will require treatment right away. Serious causes of chest pain include:  Heart attack.  A tear in the body's main blood vessel.  Redness and swelling (inflammation)  around your heart.  Blood clot in your lungs. Other causes of chest pain may not be so serious. These include:  Heartburn.  Anxiety or stress.  Damage to bones or muscles in your chest.  Lung infections. Chest pain can feel like:  Pain or discomfort in your chest.  Crushing, pressure, aching, or squeezing pain.  Burning or tingling.  Dull or sharp pain that is worse when you move, cough, or take a deep breath.  Pain or discomfort that is also felt in your back, neck, jaw, shoulder, or arm, or pain that spreads to any of these areas. It is hard to know whether your pain is caused by something that is serious or something that is not so serious. So it is important to see your doctor right away if you have chest pain. Follow these instructions at home: Medicines  Take over-the-counter and prescription medicines only as told by your doctor.  If you were prescribed an antibiotic medicine, take it as told by your doctor. Do not stop taking the antibiotic even if you start to feel better. Lifestyle   Rest as told by your doctor.  Do not use any products that contain nicotine or tobacco, such as cigarettes, e-cigarettes, and chewing tobacco. If you need help quitting, ask your doctor.  Do not drink alcohol.  Make lifestyle changes as told by your doctor. These may include: ? Getting regular exercise. Ask your doctor what activities are safe for you. ? Eating a heart-healthy diet. A diet and nutrition specialist (dietitian) can help you to learn healthy eating options. ? Staying at a healthy weight. ? Treating diabetes  or high blood pressure, if needed. ? Lowering your stress. Activities such as yoga and relaxation techniques can help. General instructions  Pay attention to any changes in your symptoms. Tell your doctor about them or any new symptoms.  Avoid any activities that cause chest pain.  Keep all follow-up visits as told by your doctor. This is important. You may  need more testing if your chest pain does not go away. Contact a doctor if:  Your chest pain does not go away.  You feel depressed.  You have a fever. Get help right away if:  Your chest pain is worse.  You have a cough that gets worse, or you cough up blood.  You have very bad (severe) pain in your belly (abdomen).  You pass out (faint).  You have either of these for no clear reason: ? Sudden chest discomfort. ? Sudden discomfort in your arms, back, neck, or jaw.  You have shortness of breath at any time.  You suddenly start to sweat, or your skin gets clammy.  You feel sick to your stomach (nauseous).  You throw up (vomit).  You suddenly feel lightheaded or dizzy.  You feel very weak or tired.  Your heart starts to beat fast, or it feels like it is skipping beats. These symptoms may be an emergency. Do not wait to see if the symptoms will go away. Get medical help right away. Call your local emergency services (911 in the [place] Do not drive yourself to the hospital. Summary  Chest pain can be caused by many different conditions. The cause may be serious and need treatment right away. If you have chest pain, see your doctor right away.  Follow your doctor's instructions for taking medicines and making lifestyle changes.  Keep all follow-up visits as told by your doctor. This includes visits for any further testing if your chest pain does not go away.  Be sure to know the signs that show that your condition has become worse. Get help right away if you have these symptoms. This information is not intended to replace advice given to you by your health care provider. Make sure you discuss any questions you have with your health care provider. Document Revised: 03/31/2018 Document Reviewed: 03/31/2018 Elsevier Patient Education  2020 Elsevier Inc.       Edwina Barth, MD [facility]

## 2020-05-03 MED FILL — CITALOPRAM HBR 20 MG TABLET: 20 | 30 days supply | Qty: 30 | Fill #0

## 2020-05-03 MED FILL — PROPRANOLOL 20 MG TABLET: 20 | 30 days supply | Qty: 60 | Fill #0

## 2020-05-03 MED FILL — hydrOXYzine HCL 10 MG TABS: 10 | 30 days supply | Qty: 60 | Fill #0

## 2020-05-06 ENCOUNTER — Encounter: Payer: Self-pay | Admitting: Family Medicine

## 2020-05-06 ENCOUNTER — Other Ambulatory Visit: Payer: Self-pay

## 2020-05-06 ENCOUNTER — Ambulatory Visit (INDEPENDENT_AMBULATORY_CARE_PROVIDER_SITE_OTHER): Payer: No Typology Code available for payment source | Admitting: Family Medicine

## 2020-05-06 VITALS — BP 115/81 | HR 86 | Temp 98.7°F | Resp 16 | Ht 71.75 in | Wt 215.2 lb

## 2020-05-06 DIAGNOSIS — E785 Hyperlipidemia, unspecified: Secondary | ICD-10-CM | POA: Diagnosis not present

## 2020-05-06 DIAGNOSIS — R079 Chest pain, unspecified: Secondary | ICD-10-CM

## 2020-05-06 NOTE — Patient Instructions (Addendum)
Try restarting Lipitor once per day.  If any aches or pains or chest symptoms return, then okay to stop it.  Follow-up for lab only visit in the next 4 weeks if still on medication.  Let me know if any new side effects.  Although your chest pain sounds atypical for heart, I think it is reasonable with yourfamily history to get that checked out further.  I will refer you to cardiology.Return to the clinic or go to the nearest emergency room if any of your symptoms worsen or new symptoms occur.     If you have lab work done today you will be contacted with your lab results within the next 2 weeks.  If you have not heard from [place] then please contact us. The fastest way to get your results is to register for My Chart.   IF you received an x-ray today, you will receive an invoice from [facility]. Please contact [facility] at [phone] with questions or concerns regarding your invoice.   IF you received labwork today, you will receive an invoice from [place]. Please contact [facility] at [phone] with questions or concerns regarding your invoice.   Our billing staff will not be able to assist you with questions regarding bills from these companies.  You will be contacted with the lab results as soon as they are available. The fastest way to get your results is to activate your My Chart account. Instructions are located on the last page of this paperwork. If you have not heard from [place] regarding the results in 2 weeks, please contact this office.

## 2020-05-06 NOTE — Progress Notes (Signed)
Subjective:  Patient ID: Isaac Ellis, male    DOB: 11-26-1980  Age: 39 y.o. MRN: [id number]  CC:  Chief Complaint  Patient presents with  . Hyperlipidemia    pt stopped lipator last week after getting some chest pains which have since resolved, pt does report working on diet and exercise to help improve his numbers.     HPI [name] presents for   Hyperlipidemia: Significant hyperlipidemia noted in June with LDL of 217, suspected genetic/familial component.  Family history was significant for father with quintuple bypass at age 34 but he was also a smoker.  Paternal uncles with myocardial infarction as well.  He was started on Lipitor 10 mg daily.  Seen July 19 with chest pain.  Pleuritic at that time.  Seen by Dr. Alvy Bimler.  EKG with normal sinus rhythm, no acute ischemic changes.  Early repol changes noted.  Chest x-ray without concerns. Woke up in middle of night with mid to right sided chest pain. Not dyspneic, but hurt to take deep breath. Improved at OV with Dr. Alvy Bimler. Pain has improved since that time. Very minimal sensation with end of deep breath - slight sensation. No recent prolonged car travel or air travel, no recent calf pain or swelling.no hx of DVT. No cough. No hemoptysis.   Had made diet changes, smaller meals, earlier in the day. Had been exercising prior - 5-6 days per week. Had been feeling well.  Patient stopped Lipitor after the episode of chest pain.  No chest pain with exercise. Had no side effects otherwise with lipitor.  Wt Readings from Last 3 Encounters:  05/06/20 (!) 215 lb 3.2 oz (97.6 kg)  04/29/20 213 lb (96.6 kg)  03/25/20 220 lb (99.8 kg)      Lab Results  Component Value Date   CHOL 282 (H) 03/25/2020   HDL 40 03/25/2020   LDLCALC 217 (H) 03/25/2020   TRIG 137 03/25/2020   CHOLHDL 7.1 (H) 03/25/2020   Lab Results  Component Value Date   ALT 71 (H) 03/25/2020   AST 28 03/25/2020   ALKPHOS 98 03/25/2020   BILITOT 0.9  03/25/2020       History There are no problems to display for this patient.  Past Medical History:  Diagnosis Date  . Anxiety   . Muscle disorder    sciatica   Past Surgical History:  Procedure Laterality Date  . EYE SURGERY     Lasik   No Known Allergies Prior to Admission medications   Medication Sig Start Date End Date Taking? Authorizing Provider  amphetamine-dextroamphetamine (ADDERALL) 30 MG tablet Take 30 mg by mouth daily.   Yes [provider]  atorvastatin (LIPITOR) 10 MG tablet Take 1 tablet (10 mg total) by mouth daily. Patient not taking: Reported on 05/06/2020 03/25/20   Shade Flood, MD   Social History   Socioeconomic History  . Marital status: Married    Spouse name: Not on file  . Number of children: Not on file  . Years of education: Not on file  . Highest education level: Not on file  Occupational History  . Occupation: Sport and exercise psychologist  Tobacco Use  . Smoking status: Never Smoker  . Smokeless tobacco: Never Used  Vaping Use  . Vaping Use: Never used  Substance and Sexual Activity  . Alcohol use: Yes    Alcohol/week: 1.0 standard drink    Types: 1 Cans of beer per week    Comment: 1 beer a  month  . Drug use: Never  . Sexual activity: Yes  Other Topics Concern  . Not on file  Social History Narrative  . Not on file   Social Determinants of Health   Financial Resource Strain:   . Difficulty of Paying Living Expenses:   Food Insecurity:   . Worried About Programme researcher, broadcasting/film/video in the Last Year:   . Barista in the Last Year:   Transportation Needs:   . Freight forwarder (Medical):   Marland Kitchen Lack of Transportation (Non-Medical):   Physical Activity:   . Days of Exercise per Week:   . Minutes of Exercise per Session:   Stress:   . Feeling of Stress :   Social Connections:   . Frequency of Communication with Friends and Family:   . Frequency of Social Gatherings with Friends and Family:   . Attends Religious  Services:   . Active Member of Clubs or Organizations:   . Attends Banker Meetings:   Marland Kitchen Marital Status:   Intimate Partner Violence:   . Fear of Current or Ex-Partner:   . Emotionally Abused:   Marland Kitchen Physically Abused:   . Sexually Abused:     Review of Systems  Constitutional: Negative for fatigue and unexpected weight change.  Eyes: Negative for visual disturbance.  Respiratory: Negative for cough, chest tightness and shortness of breath.   Cardiovascular: Negative for chest pain, palpitations and leg swelling.  Gastrointestinal: Negative for abdominal pain and blood in stool.  Neurological: Negative for dizziness, light-headedness and headaches.     Objective:   Vitals:   05/06/20 0803  BP: 115/81  Pulse: 86  Resp: 16  Temp: 98.7 F (37.1 C)  TempSrc: Temporal  SpO2: 99%  Weight: (!) 215 lb 3.2 oz (97.6 kg)  Height: 5' 11.75" (1.822 m)     Physical Exam Vitals reviewed.  Constitutional:      Appearance: He is well-developed.  HENT:     Head: Normocephalic and atraumatic.  Eyes:     Pupils: Pupils are equal, round, and reactive to light.  Neck:     Vascular: No carotid bruit or JVD.  Cardiovascular:     Rate and Rhythm: Normal rate and regular rhythm.     Heart sounds: Normal heart sounds. No murmur heard.   Pulmonary:     Effort: Pulmonary effort is normal.     Breath sounds: Normal breath sounds. No rales.  Skin:    General: Skin is warm and dry.  Neurological:     Mental Status: He is alert and oriented to person, place, and time.      Assessment & Plan:  Isaac Ellis is a 39 y.o. male . Chest pain, unspecified type - Plan: Ambulatory referral to Cardiology  -Pleuritic in nature, 1 week ago, resolving without any specific intervention.  Previous x-ray, EKG reassuring, no known PE risk factors.  Symptoms are improving as above.  Atypical for cardiac cause, but does have significant family history of coronary disease including his  father with bypass in his early 91s.  -Refer to cardiology to decide on further testing, with ER/RTC precautions given.  Hyperlipidemia, unspecified hyperlipidemia type - Plan: Ambulatory referral to Cardiology, Comprehensive metabolic panel, Lipid panel  -Tolerating Lipitor initially, less likely chest pain cause.  We will try restarting that once per day.  Significant hyperlipidemia noted previously with suspected familial/genetic component.  Lab only visit in 4 weeks on meds, office visit 69-month.. rtc precautions .  No orders of the defined types were placed in this encounter.  Patient Instructions   Try restarting Lipitor once per day.  If any aches or pains or chest symptoms return, then okay to stop it.  Follow-up for lab only visit in the next 4 weeks if still on medication.  Let me know if any new side effects.  Although your chest pain sounds atypical for heart, I think it is reasonable with yourfamily history to get that checked out further.  I will refer you to cardiology.Return to the clinic or go to the nearest emergency room if any of your symptoms worsen or new symptoms occur.     If you have lab work done today you will be contacted with your lab results within the next 2 weeks.  If you have not heard from [place] then please contact us. The fastest way to get your results is to register for My Chart.   IF you received an x-ray today, you will receive an invoice from [facility]. Please contact [facility] at [phone] with questions or concerns regarding your invoice.   IF you received labwork today, you will receive an invoice from [place]. Please contact [facility] at [phone] with questions or concerns regarding your invoice.   Our billing staff will not be able to assist you with questions regarding bills from these companies.  You will be contacted with the lab results as soon as they are available. The fastest way to get your results is to activate  your My Chart account. Instructions are located on the last page of this paperwork. If you have not heard from [place] regarding the results in 2 weeks, please contact this office.         Signed, Meredith Staggers, MD [facility]

## 2020-06-03 ENCOUNTER — Ambulatory Visit (INDEPENDENT_AMBULATORY_CARE_PROVIDER_SITE_OTHER): Payer: No Typology Code available for payment source | Admitting: Emergency Medicine

## 2020-06-03 ENCOUNTER — Other Ambulatory Visit: Payer: Self-pay

## 2020-06-03 DIAGNOSIS — E785 Hyperlipidemia, unspecified: Secondary | ICD-10-CM

## 2020-06-03 MED FILL — AMPHETAMINE-DEXTROAMPHETAMI: 15 | 30 days supply | Qty: 90 | Fill #0

## 2020-06-04 LAB — LIPID PANEL
Chol/HDL Ratio: 4.4 ratio (ref 0.0–5.0)
Cholesterol, Total: 197 mg/dL (ref 100–199)
HDL: 45 mg/dL (ref >39)
LDL Chol Calc (NIH): 136 mg/dL — ABNORMAL HIGH (ref 0–99)
Triglycerides: 90 mg/dL (ref 0–149)
VLDL Cholesterol Cal: 16 mg/dL (ref 5–40)

## 2020-06-04 LAB — COMPREHENSIVE METABOLIC PANEL
ALT: 70 IU/L — ABNORMAL HIGH (ref 0–44)
AST: 23 IU/L (ref 0–40)
Albumin/Globulin Ratio: 1.7 (ref 1.2–2.2)
Albumin: 4.5 g/dL (ref 4.0–5.0)
Alkaline Phosphatase: 109 IU/L (ref 48–121)
BUN/Creatinine Ratio: 17 (ref 9–20)
BUN: 20 mg/dL (ref 6–24)
Bilirubin Total: 1 mg/dL (ref 0.0–1.2)
CO2: 21 mmol/L (ref 20–29)
Calcium: 9.6 mg/dL (ref 8.7–10.2)
Chloride: 101 mmol/L (ref 96–106)
Creatinine, Ser: 1.21 mg/dL (ref 0.76–1.27)
GFR calc Af Amer: 85 mL/min/{1.73_m2} (ref >59)
GFR calc non Af Amer: 74 mL/min/{1.73_m2} (ref >59)
Globulin, Total: 2.7 g/dL (ref 1.5–4.5)
Glucose: 89 mg/dL (ref 65–99)
Potassium: 5 mmol/L (ref 3.5–5.2)
Sodium: 138 mmol/L (ref 134–144)
Total Protein: 7.2 g/dL (ref 6.0–8.5)

## 2020-06-18 MED FILL — hydrOXYzine HCL 10 MG TABS: 10 | 30 days supply | Qty: 60 | Fill #0

## 2020-06-28 MED FILL — PROPRANOLOL 20 MG TABLET: 20 | 30 days supply | Qty: 60 | Fill #0

## 2020-06-28 MED FILL — CITALOPRAM HBR 20 MG TABLET: 20 | 30 days supply | Qty: 30 | Fill #0

## 2020-07-01 ENCOUNTER — Ambulatory Visit (INDEPENDENT_AMBULATORY_CARE_PROVIDER_SITE_OTHER)
Admission: RE | Admit: 2020-07-01 | Discharge: 2020-07-01 | Disposition: A | Discharge status: Home / Self Care | Payer: Self-pay | Source: Ambulatory Visit | Attending: Cardiovascular Disease | Admitting: Cardiovascular Disease

## 2020-07-01 ENCOUNTER — Encounter: Payer: Self-pay | Admitting: Cardiovascular Disease

## 2020-07-01 ENCOUNTER — Other Ambulatory Visit: Payer: Self-pay

## 2020-07-01 ENCOUNTER — Ambulatory Visit: Payer: No Typology Code available for payment source | Admitting: Cardiovascular Disease

## 2020-07-01 VITALS — BP 106/80 | HR 103 | Ht 71.75 in | Wt 215.0 lb

## 2020-07-01 DIAGNOSIS — Z8249 Family history of ischemic heart disease and other diseases of the circulatory system: Secondary | ICD-10-CM | POA: Diagnosis not present

## 2020-07-01 DIAGNOSIS — E785 Hyperlipidemia, unspecified: Secondary | ICD-10-CM

## 2020-07-01 DIAGNOSIS — E782 Mixed hyperlipidemia: Secondary | ICD-10-CM

## 2020-07-01 MED FILL — AMPHETAMINE-DEXTROAMPHETAMI: 15 | 30 days supply | Qty: 90 | Fill #0

## 2020-07-01 NOTE — Patient Instructions (Addendum)
Medication Instructions:  Your provider recommends that you continue on your current medications as directed. Please refer to the Current Medication list given to you today.   *If you need a refill on your cardiac medications before your next appointment, please call your pharmacy*  Testing/Procedures: Dr. Elease Hashimoto recommends you have a CALCIUM SCORE.  Follow-Up: At [facility], you and your health needs are our priority.  As part of our continuing mission to provide you with exceptional heart care, we have created designated Provider Care Teams.  These Care Teams include your primary Cardiologist (physician) and Advanced Practice Providers (APPs -  Physician Assistants and Nurse Practitioners) who all work together to provide you with the care you need, when you need it. Your next appointment:   12 month(s) The format for your next appointment:   In Person Provider:   You may see Dr. Elease Hashimoto. or one of the following Advanced Practice Providers on your designated Care Team:    Tereso Newcomer, PA-C  Vin Liberty, PA-C    Mediterranean Diet A Mediterranean diet refers to food and lifestyle choices that are based on the traditions of countries located on the [place]. This way of eating has been shown to help prevent certain conditions and improve outcomes for people who have chronic diseases, like kidney disease and heart disease. What are tips for following this plan? Lifestyle  Cook and eat meals together with your family, when possible.  Drink enough fluid to keep your urine clear or pale yellow.  Be physically active every day. This includes: ? Aerobic exercise like running or swimming. ? Leisure activities like gardening, walking, or housework.  Get 7-8 hours of sleep each night.  If recommended by your health care provider, drink red wine in moderation. This means 1 glass a day for nonpregnant women and 2 glasses a day for men. A glass of wine equals 5 oz (150  mL). Reading food labels   Check the serving size of packaged foods. For foods such as rice and pasta, the serving size refers to the amount of cooked product, not dry.  Check the total fat in packaged foods. Avoid foods that have saturated fat or trans fats.  Check the ingredients list for added sugars, such as corn syrup. Shopping  At the grocery store, buy most of your food from the areas near the walls of the store. This includes: ? Fresh fruits and vegetables (produce). ? Grains, beans, nuts, and seeds. Some of these may be available in unpackaged forms or large amounts (in bulk). ? Fresh seafood. ? Poultry and eggs. ? Low-fat dairy products.  Buy whole ingredients instead of prepackaged foods.  Buy fresh fruits and vegetables in-season from local farmers markets.  Buy frozen fruits and vegetables in resealable bags.  If you do not have access to quality fresh seafood, buy precooked frozen shrimp or canned fish, such as tuna, salmon, or sardines.  Buy small amounts of raw or cooked vegetables, salads, or olives from the deli or salad bar at your store.  Stock your pantry so you always have certain foods on hand, such as olive oil, canned tuna, canned tomatoes, rice, pasta, and beans. Cooking  Cook foods with extra-virgin olive oil instead of using butter or other vegetable oils.  Have meat as a side dish, and have vegetables or grains as your main dish. This means having meat in small portions or adding small amounts of meat to foods like pasta or stew.  Use beans or  vegetables instead of meat in common dishes like chili or lasagna.  Experiment with different cooking methods. Try roasting or broiling vegetables instead of steaming or sauteing them.  Add frozen vegetables to soups, stews, pasta, or rice.  Add nuts or seeds for added healthy fat at each meal. You can add these to yogurt, salads, or vegetable dishes.  Marinate fish or vegetables using olive oil, lemon  juice, garlic, and fresh herbs. Meal planning   Plan to eat 1 vegetarian meal one day each week. Try to work up to 2 vegetarian meals, if possible.  Eat seafood 2 or more times a week.  Have healthy snacks readily available, such as: ? Vegetable sticks with hummus. ? [place] yogurt. ? Fruit and nut trail mix.  Eat balanced meals throughout the week. This includes: ? Fruit: 2-3 servings a day ? Vegetables: 4-5 servings a day ? Low-fat dairy: 2 servings a day ? Fish, poultry, or lean meat: 1 serving a day ? Beans and legumes: 2 or more servings a week ? Nuts and seeds: 1-2 servings a day ? Whole grains: 6-8 servings a day ? Extra-virgin olive oil: 3-4 servings a day  Limit red meat and sweets to only a few servings a month What are my food choices?  Mediterranean diet ? Recommended  Grains: Whole-grain pasta. Brown rice. Bulgar wheat. Polenta. Couscous. Whole-wheat bread. Orpah Cobb.  Vegetables: Artichokes. Beets. Broccoli. Cabbage. Carrots. Eggplant. Green beans. Chard. Kale. Spinach. Onions. Leeks. Peas. Squash. Tomatoes. Peppers. Radishes.  Fruits: Apples. Apricots. Avocado. Berries. Bananas. Cherries. Dates. Figs. Grapes. Lemons. Melon. Oranges. Peaches. Plums. Pomegranate.  Meats and other protein foods: Beans. Almonds. Sunflower seeds. Pine nuts. Peanuts. Cod. Salmon. Scallops. Shrimp. Tuna. Tilapia. Clams. Oysters. Eggs.  Dairy: Low-fat milk. Cheese. Greek yogurt.  Beverages: Water. Red wine. Herbal tea.  Fats and oils: Extra virgin olive oil. Avocado oil. Grape seed oil.  Sweets and desserts: [place] yogurt with honey. Baked apples. Poached pears. Trail mix.  Seasoning and other foods: Basil. Cilantro. Coriander. Cumin. Mint. Parsley. Sage. Rosemary. Tarragon. Garlic. Oregano. Thyme. Pepper. Balsalmic vinegar. Tahini. Hummus. Tomato sauce. Olives. Mushrooms. ? Limit these  Grains: Prepackaged pasta or rice dishes. Prepackaged cereal with added  sugar.  Vegetables: Deep fried potatoes (french fries).  Fruits: Fruit canned in syrup.  Meats and other protein foods: Beef. Pork. Lamb. Poultry with skin. Hot dogs. Tomasa Blase.  Dairy: Ice cream. Sour cream. Whole milk.  Beverages: Juice. Sugar-sweetened soft drinks. Beer. Liquor and spirits.  Fats and oils: Butter. Canola oil. Vegetable oil. Beef fat (tallow). Lard.  Sweets and desserts: Cookies. Cakes. Pies. Candy.  Seasoning and other foods: Mayonnaise. Premade sauces and marinades. The items listed may not be a complete list. Talk with your dietitian about what dietary choices are right for you. Summary  The Mediterranean diet includes both food and lifestyle choices.  Eat a variety of fresh fruits and vegetables, beans, nuts, seeds, and whole grains.  Limit the amount of red meat and sweets that you eat.  Talk with your health care provider about whether it is safe for you to drink red wine in moderation. This means 1 glass a day for nonpregnant women and 2 glasses a day for men. A glass of wine equals 5 oz (150 mL). This information is not intended to replace advice given to you by your health care provider. Make sure you discuss any questions you have with your health care provider. Document Revised: 05/28/2016 Document Reviewed: 05/21/2016 Elsevier Patient Education  2020 Elsevier Inc.  

## 2020-07-01 NOTE — Progress Notes (Signed)
Cardiology Office Note:    Date:  07/01/2020   ID:  Isaac Ellis, DOB 04-08-1981, MRN [id number]  PCP:  Isaac Quint, MD  [facility] Cardiologist:  No primary care provider on file.  [facility] Electrophysiologist:  None   Referring MD: Isaac Flood, MD   Chief Complaint  Patient presents with  . Hyperlipidemia    History of Present Illness:    Isaac Ellis is a 39 y.o. male with a hx of hyperlipidemia and a family history of coronary disease.  We are asked to see him today by Isaac Ellis for further evaluation of his hyperlipidemia and cardiac risk.  He was recently seen by his primary MD Chol levels were found to be elevated.  Very strong family hx ov CAD  His father had bypass at age  57.  He is maternal uncles also had coronary artery disease. Works out 5 days a week - cardio work outs ,  HIIT routine .  Has had MSK pain  Works as a Sport and exercise psychologist.  Was not restricting diet previously, now watches his diet  Non smoker    Past Medical History:  Diagnosis Date  . Anxiety   . Muscle disorder    sciatica    Past Surgical History:  Procedure Laterality Date  . EYE SURGERY     Lasik    Current Medications: Current Meds  Medication Sig  . amphetamine-dextroamphetamine (ADDERALL) 30 MG tablet Take 30 mg by mouth daily.  Marland Kitchen atorvastatin (LIPITOR) 10 MG tablet Take 1 tablet (10 mg total) by mouth daily.     Allergies:   Patient has no known allergies.   Social History   Socioeconomic History  . Marital status: Married    Spouse name: Not on file  . Number of children: Not on file  . Years of education: Not on file  . Highest education level: Not on file  Occupational History  . Occupation: Sport and exercise psychologist  Tobacco Use  . Smoking status: Never Smoker  . Smokeless tobacco: Never Used  Vaping Use  . Vaping Use: Never used  Substance and Sexual Activity  . Alcohol use: Yes    Alcohol/week: 1.0 standard drink    Types: 1  Cans of beer per week    Comment: 1 beer a month  . Drug use: Never  . Sexual activity: Yes  Other Topics Concern  . Not on file  Social History Narrative  . Not on file   Social Determinants of Health   Financial Resource Strain:   . Difficulty of Paying Living Expenses: Not on file  Food Insecurity:   . Worried About Programme researcher, broadcasting/film/video in the Last Year: Not on file  . Ran Out of Food in the Last Year: Not on file  Transportation Needs:   . Lack of Transportation (Medical): Not on file  . Lack of Transportation (Non-Medical): Not on file  Physical Activity:   . Days of Exercise per Week: Not on file  . Minutes of Exercise per Session: Not on file  Stress:   . Feeling of Stress : Not on file  Social Connections:   . Frequency of Communication with Friends and Family: Not on file  . Frequency of Social Gatherings with Friends and Family: Not on file  . Attends Religious Services: Not on file  . Active Member of Clubs or Organizations: Not on file  . Attends Banker Meetings: Not on file  . Marital Status:  Not on file     Family History: The patient's family history includes Cancer in his maternal grandfather, mother, paternal grandfather, and sister; Heart disease in his father; Hyperlipidemia in his father; Hypertension in his father; Mental illness in his mother; Stroke in his paternal grandmother.  ROS:   Please see the history of present illness.     All other systems reviewed and are negative.  EKGs/Labs/Other Studies Reviewed:    The following studies were reviewed today:   EKG:    April 29, 2020: Sinus tachycardia.  Early repolarization changes.   Recent Labs: 04/29/2020: Hemoglobin 13.2 06/03/2020: ALT 70; BUN 20; Creatinine, Ser 1.21; Potassium 5.0; Sodium 138  Recent Lipid Panel    Component Value Date/Time   CHOL 197 06/03/2020 0811   TRIG 90 06/03/2020 0811   HDL 45 06/03/2020 0811   CHOLHDL 4.4 06/03/2020 0811   LDLCALC 136 (H)  06/03/2020 0811    Physical Exam:    VS:  BP 106/80   Pulse (!) 103   Ht 5' 11.75" (1.822 m)   Wt 215 lb (97.5 kg)   SpO2 98%   BMI 29.36 kg/m     Wt Readings from Last 3 Encounters:  07/01/20 215 lb (97.5 kg)  05/06/20 (!) 215 lb 3.2 oz (97.6 kg)  04/29/20 213 lb (96.6 kg)     GEN:  Well nourished, well developed in no acute distress,  Very fit young man HEENT: Normal NECK: No JVD; No carotid bruits LYMPHATICS: No lymphadenopathy CARDIAC: RRR, no murmurs, rubs, gallops RESPIRATORY:  Clear to auscultation without rales, wheezing or rhonchi  ABDOMEN: Soft, non-tender, non-distended MUSCULOSKELETAL:  No edema; No deformity  SKIN: Warm and dry NEUROLOGIC:  Alert and oriented x 3 PSYCHIATRIC:  Normal affect   ASSESSMENT:    1. Hyperlipidemia, unspecified hyperlipidemia type   2. Family history of early CAD    PLAN:    In order of problems listed above:  1. Hyperlipidemia: Isaac Ellis  presents for further discussion of his hyperlipidemia.  He has a very strong family history of coronary artery disease.  His father and 2 uncles all had premature coronary artery disease that developed in their 30s/40s.  He was found to have hyperlipidemia.  Since that time he is greatly improved his diet.  He still has started atorvastatin 10 mg a day and his lipids have improved significantly.  His initial LDL was 217.  His last LDL was 136.  Given his risk factors, I would like to do a coronary calcium score.  I would have a very low threshold to start very high-dose high potency statin in an effort to get his LDL around 70.  We will review the coronary calcium score before we make a final decision on that.  We will give him information on Mediterranean diet. I have advised him to avoid carbs.  I will see him again in 1 year for follow-up visit.   Medication Adjustments/Labs and Tests Ordered: Current medicines are reviewed at length with the patient today.  Concerns regarding medicines  are outlined above.  Orders Placed This Encounter  Procedures  . CT CARDIAC SCORING   No orders of the defined types were placed in this encounter.   Patient Instructions  Medication Instructions:  Your provider recommends that you continue on your current medications as directed. Please refer to the Current Medication list given to you today.   *If you need a refill on your cardiac medications before your next appointment, please call  your pharmacy*  Testing/Procedures: Dr. Elease Hashimoto recommends you have a CALCIUM SCORE.  Follow-Up: At [facility], you and your health needs are our priority.  As part of our continuing mission to provide you with exceptional heart care, we have created designated Provider Care Teams.  These Care Teams include your primary Cardiologist (physician) and Advanced Practice Providers (APPs -  Physician Assistants and Nurse Practitioners) who all work together to provide you with the care you need, when you need it. Your next appointment:   12 month(s) The format for your next appointment:   In Person Provider:   You may see Dr. Elease Hashimoto. or one of the following Advanced Practice Providers on your designated Care Team:    Tereso Newcomer, PA-C  Vin [place], PA-C    Mediterranean Diet A Mediterranean diet refers to food and lifestyle choices that are based on the traditions of countries located on the [place]. This way of eating has been shown to help prevent certain conditions and improve outcomes for people who have chronic diseases, like kidney disease and heart disease. What are tips for following this plan? Lifestyle  Cook and eat meals together with your family, when possible.  Drink enough fluid to keep your urine clear or pale yellow.  Be physically active every day. This includes: ? Aerobic exercise like running or swimming. ? Leisure activities like gardening, walking, or housework.  Get 7-8 hours of sleep each night.  If recommended  by your health care provider, drink red wine in moderation. This means 1 glass a day for nonpregnant women and 2 glasses a day for men. A glass of wine equals 5 oz (150 mL). Reading food labels   Check the serving size of packaged foods. For foods such as rice and pasta, the serving size refers to the amount of cooked product, not dry.  Check the total fat in packaged foods. Avoid foods that have saturated fat or trans fats.  Check the ingredients list for added sugars, such as corn syrup. Shopping  At the grocery store, buy most of your food from the areas near the walls of the store. This includes: ? Fresh fruits and vegetables (produce). ? Grains, beans, nuts, and seeds. Some of these may be available in unpackaged forms or large amounts (in bulk). ? Fresh seafood. ? Poultry and eggs. ? Low-fat dairy products.  Buy whole ingredients instead of prepackaged foods.  Buy fresh fruits and vegetables in-season from local farmers markets.  Buy frozen fruits and vegetables in resealable bags.  If you do not have access to quality fresh seafood, buy precooked frozen shrimp or canned fish, such as tuna, salmon, or sardines.  Buy small amounts of raw or cooked vegetables, salads, or olives from the deli or salad bar at your store.  Stock your pantry so you always have certain foods on hand, such as olive oil, canned tuna, canned tomatoes, rice, pasta, and beans. Cooking  Cook foods with extra-virgin olive oil instead of using butter or other vegetable oils.  Have meat as a side dish, and have vegetables or grains as your main dish. This means having meat in small portions or adding small amounts of meat to foods like pasta or stew.  Use beans or vegetables instead of meat in common dishes like chili or lasagna.  Experiment with different cooking methods. Try roasting or broiling vegetables instead of steaming or sauteing them.  Add frozen vegetables to soups, stews, pasta, or  rice.  Add nuts or  seeds for added healthy fat at each meal. You can add these to yogurt, salads, or vegetable dishes.  Marinate fish or vegetables using olive oil, lemon juice, garlic, and fresh herbs. Meal planning   Plan to eat 1 vegetarian meal one day each week. Try to work up to 2 vegetarian meals, if possible.  Eat seafood 2 or more times a week.  Have healthy snacks readily available, such as: ? Vegetable sticks with hummus. ? [place] yogurt. ? Fruit and nut trail mix.  Eat balanced meals throughout the week. This includes: ? Fruit: 2-3 servings a day ? Vegetables: 4-5 servings a day ? Low-fat dairy: 2 servings a day ? Fish, poultry, or lean meat: 1 serving a day ? Beans and legumes: 2 or more servings a week ? Nuts and seeds: 1-2 servings a day ? Whole grains: 6-8 servings a day ? Extra-virgin olive oil: 3-4 servings a day  Limit red meat and sweets to only a few servings a month What are my food choices?  Mediterranean diet ? Recommended  Grains: Whole-grain pasta. Brown rice. Bulgar wheat. Polenta. Couscous. Whole-wheat bread. Orpah Cobb.  Vegetables: Artichokes. Beets. Broccoli. Cabbage. Carrots. Eggplant. Green beans. Chard. Kale. Spinach. Onions. Leeks. Peas. Squash. Tomatoes. Peppers. Radishes.  Fruits: Apples. Apricots. Avocado. Berries. Bananas. Cherries. Dates. Figs. Grapes. Lemons. Melon. Oranges. Peaches. Plums. Pomegranate.  Meats and other protein foods: Beans. Almonds. Sunflower seeds. Pine nuts. Peanuts. Cod. Salmon. Scallops. Shrimp. Tuna. Tilapia. Clams. Oysters. Eggs.  Dairy: Low-fat milk. Cheese. Greek yogurt.  Beverages: Water. Red wine. Herbal tea.  Fats and oils: Extra virgin olive oil. Avocado oil. Grape seed oil.  Sweets and desserts: [place] yogurt with honey. Baked apples. Poached pears. Trail mix.  Seasoning and other foods: Basil. Cilantro. Coriander. Cumin. Mint. Parsley. Sage. Rosemary. Tarragon. Garlic. Oregano. Thyme. Pepper.  Balsalmic vinegar. Tahini. Hummus. Tomato sauce. Olives. Mushrooms. ? Limit these  Grains: Prepackaged pasta or rice dishes. Prepackaged cereal with added sugar.  Vegetables: Deep fried potatoes (french fries).  Fruits: Fruit canned in syrup.  Meats and other protein foods: Beef. Pork. Lamb. Poultry with skin. Hot dogs. Tomasa Blase.  Dairy: Ice cream. Sour cream. Whole milk.  Beverages: Juice. Sugar-sweetened soft drinks. Beer. Liquor and spirits.  Fats and oils: Butter. Canola oil. Vegetable oil. Beef fat (tallow). Lard.  Sweets and desserts: Cookies. Cakes. Pies. Candy.  Seasoning and other foods: Mayonnaise. Premade sauces and marinades. The items listed may not be a complete list. Talk with your dietitian about what dietary choices are right for you. Summary  The Mediterranean diet includes both food and lifestyle choices.  Eat a variety of fresh fruits and vegetables, beans, nuts, seeds, and whole grains.  Limit the amount of red meat and sweets that you eat.  Talk with your health care provider about whether it is safe for you to drink red wine in moderation. This means 1 glass a day for nonpregnant women and 2 glasses a day for men. A glass of wine equals 5 oz (150 mL). This information is not intended to replace advice given to you by your health care provider. Make sure you discuss any questions you have with your health care provider. Document Revised: 05/28/2016 Document Reviewed: 05/21/2016 Elsevier Patient Education  2020 [employer].     Signed, Kristeen Miss, MD  07/01/2020 6:19 PM    [facility]

## 2020-07-02 ENCOUNTER — Other Ambulatory Visit (HOSPITAL_COMMUNITY): Payer: Self-pay | Admitting: Cardiovascular Disease

## 2020-07-02 ENCOUNTER — Other Ambulatory Visit: Payer: Self-pay | Admitting: *Deleted

## 2020-07-02 DIAGNOSIS — E782 Mixed hyperlipidemia: Secondary | ICD-10-CM

## 2020-07-02 MED ORDER — ROSUVASTATIN CALCIUM 10 MG PO TABS: 10.0000 mg | ORAL_TABLET | Freq: Every day | ORAL | 3 refills | Status: DC

## 2020-07-02 MED FILL — ROSUVASTATIN CALCIUM 10 MG: 10 | 90 days supply | Qty: 90 | Fill #0

## 2020-08-07 ENCOUNTER — Other Ambulatory Visit: Payer: Self-pay

## 2020-08-07 ENCOUNTER — Encounter: Payer: Self-pay | Admitting: Family Medicine

## 2020-08-07 ENCOUNTER — Ambulatory Visit (INDEPENDENT_AMBULATORY_CARE_PROVIDER_SITE_OTHER): Payer: No Typology Code available for payment source | Admitting: Family Medicine

## 2020-08-07 VITALS — BP 118/86 | HR 105 | Temp 97.6°F | Ht 71.75 in | Wt 211.0 lb

## 2020-08-07 DIAGNOSIS — R000 Tachycardia, unspecified: Secondary | ICD-10-CM | POA: Diagnosis not present

## 2020-08-07 DIAGNOSIS — Z8249 Family history of ischemic heart disease and other diseases of the circulatory system: Secondary | ICD-10-CM

## 2020-08-07 DIAGNOSIS — E785 Hyperlipidemia, unspecified: Secondary | ICD-10-CM

## 2020-08-07 NOTE — Patient Instructions (Addendum)
   Elevated heart rate is likely due to medication as well as caffeine.  I will check a thyroid test perspective to be okay.  Continue Crestor and depending on labs from today can discuss higher dose if needed.  Thanks for coming in and let me know if you have questions.  If you have lab work done today you will be contacted with your lab results within the next 2 weeks.  If you have not heard from [place] then please contact us. The fastest way to get your results is to register for My Chart.   IF you received an x-ray today, you will receive an invoice from [facility]. Please contact [facility] at [phone] with questions or concerns regarding your invoice.   IF you received labwork today, you will receive an invoice from [facility]. Please contact [facility] at [phone] with questions or concerns regarding your invoice.   Our billing staff will not be able to assist you with questions regarding bills from these companies.  You will be contacted with the lab results as soon as they are available. The fastest way to get your results is to activate your My Chart account. Instructions are located on the last page of this paperwork. If you have not heard from [place] regarding the results in 2 weeks, please contact this office.

## 2020-08-07 NOTE — Progress Notes (Signed)
Subjective:  Patient ID: Isaac Ellis, male    DOB: 1981-05-11  Age: 39 y.o. MRN: [id number]  CC:  Chief Complaint  Patient presents with  . Follow-up    on chest pain. Pt has seen cardiology. Pt reports the cardiologist changed his medication to Crestor. pt reports no chest pain since last OV. no other concerns at this time.    HPI Isaac Ellis presents for   Chest pain family history of CAD.: Discussed that July 26 office visit.  Referred to cardiology given strong family history of early CAD, and hyperlipidemia.  Appointment with cardiology September 20, Dr. Elease Hashimoto.  Coronary calcium score ordered, tested on September 21.  Score of 21.7, placing him at 87th percentile for age 68.  Low threshold for statin has been discussed with cardiology with goal LDL under 70.  Mediterranean diet discussed.  Chest pain resolved, possible muscular pain previously. Currently on Crestor 10 mg daily, started September 21. Exercising most days per week - HIIT. Feels better on new diet - more mediterranean diet.  No new side effects on crestor. No new myalgias.  Fasting today.   Lab Results  Component Value Date   CHOL 197 06/03/2020   HDL 45 06/03/2020   LDLCALC 136 (H) 06/03/2020   TRIG 90 06/03/2020   CHOLHDL 4.4 06/03/2020   Lab Results  Component Value Date   ALT 70 (H) 06/03/2020   AST 23 06/03/2020   ALKPHOS 109 06/03/2020   BILITOT 1.0 06/03/2020    Tachycardia: Resting heart rate in 80-90 at home, few cups coffee each day. On adderall.  No palpitations. Asymptomatic.    history Patient Active Problem List   Diagnosis Date Noted  . Hyperlipidemia 07/01/2020   Past Medical History:  Diagnosis Date  . Anxiety   . Muscle disorder    sciatica   Past Surgical History:  Procedure Laterality Date  . EYE SURGERY     Lasik   No Known Allergies Prior to Admission medications   Medication Sig Start Date End Date Taking? Authorizing Provider    amphetamine-dextroamphetamine (ADDERALL) 30 MG tablet Take 30 mg by mouth daily.   Yes [provider]  rosuvastatin (CRESTOR) 10 MG tablet Take 1 tablet (10 mg total) by mouth daily. 07/02/20  Yes Nahser, Deloris Ping, MD   Social History   Socioeconomic History  . Marital status: Married    Spouse name: Not on file  . Number of children: Not on file  . Years of education: Not on file  . Highest education level: Not on file  Occupational History  . Occupation: Sport and exercise psychologist  Tobacco Use  . Smoking status: Never Smoker  . Smokeless tobacco: Never Used  Vaping Use  . Vaping Use: Never used  Substance and Sexual Activity  . Alcohol use: Yes    Alcohol/week: 1.0 standard drink    Types: 1 Cans of beer per week    Comment: 1 beer a month  . Drug use: Never  . Sexual activity: Yes  Other Topics Concern  . Not on file  Social History Narrative  . Not on file   Social Determinants of Health   Financial Resource Strain:   . Difficulty of Paying Living Expenses: Not on file  Food Insecurity:   . Worried About Programme researcher, broadcasting/film/video in the Last Year: Not on file  . Ran Out of Food in the Last Year: Not on file  Transportation Needs:   . Lack of Transportation (  Medical): Not on file  . Lack of Transportation (Non-Medical): Not on file  Physical Activity:   . Days of Exercise per Week: Not on file  . Minutes of Exercise per Session: Not on file  Stress:   . Feeling of Stress : Not on file  Social Connections:   . Frequency of Communication with Friends and Family: Not on file  . Frequency of Social Gatherings with Friends and Family: Not on file  . Attends Religious Services: Not on file  . Active Member of Clubs or Organizations: Not on file  . Attends Banker Meetings: Not on file  . Marital Status: Not on file  Intimate Partner Violence:   . Fear of Current or Ex-Partner: Not on file  . Emotionally Abused: Not on file  . Physically Abused: Not on file   . Sexually Abused: Not on file    Review of Systems  Constitutional: Negative for fatigue and unexpected weight change.  Eyes: Negative for visual disturbance.  Respiratory: Negative for cough, chest tightness and shortness of breath.   Cardiovascular: Negative for chest pain, palpitations and leg swelling.  Gastrointestinal: Negative for abdominal pain and blood in stool.  Neurological: Negative for dizziness, light-headedness and headaches.     Objective:   Vitals:   08/07/20 0925  BP: 118/86  Pulse: (!) 105  Temp: 97.6 F (36.4 C)  TempSrc: Temporal  SpO2: 98%  Weight: 211 lb (95.7 kg)  Height: 5' 11.75" (1.822 m)     Physical Exam Vitals reviewed.  Constitutional:      Appearance: He is well-developed.  HENT:     Head: Normocephalic and atraumatic.  Eyes:     Pupils: Pupils are equal, round, and reactive to light.  Neck:     Vascular: No carotid bruit or JVD.  Cardiovascular:     Rate and Rhythm: Regular rhythm. Tachycardia present.     Heart sounds: Normal heart sounds. No murmur heard.   Pulmonary:     Effort: Pulmonary effort is normal.     Breath sounds: Normal breath sounds. No rales.  Skin:    General: Skin is warm and dry.  Neurological:     Mental Status: He is alert and oriented to person, place, and time.  Psychiatric:        Mood and Affect: Mood normal.        Behavior: Behavior normal.        Assessment & Plan:  Isaac Ellis is a 39 y.o. male . Hyperlipidemia, unspecified hyperlipidemia type - Plan: Comprehensive metabolic panel, Lipid panel Family history of early CAD  -Tolerating Crestor, check labs, consider higher dose with goal LDL less than 70.  Tachycardia - Plan: TSH  -Related to stimulant use as well as caffeine.  Asymptomatic.  Check TSH.  No orders of the defined types were placed in this encounter.  Patient Instructions     Elevated heart rate is likely due to medication as well as caffeine.  I will check a  thyroid test perspective to be okay.  Continue Crestor and depending on labs from today can discuss higher dose if needed.  Thanks for coming in and let me know if you have questions.  If you have lab work done today you will be contacted with your lab results within the next 2 weeks.  If you have not heard from [place] then please contact us. The fastest way to get your results is to register for My Chart.  IF you received an x-ray today, you will receive an invoice from [facility]. Please contact [facility] at [phone] with questions or concerns regarding your invoice.   IF you received labwork today, you will receive an invoice from [facility]. Please contact [facility] at [phone] with questions or concerns regarding your invoice.   Our billing staff will not be able to assist you with questions regarding bills from these companies.  You will be contacted with the lab results as soon as they are available. The fastest way to get your results is to activate your My Chart account. Instructions are located on the last page of this paperwork. If you have not heard from [place] regarding the results in 2 weeks, please contact this office.         Signed, Merri Ray, MD [facility]

## 2020-08-08 LAB — COMPREHENSIVE METABOLIC PANEL WITH GFR
ALT: 60 IU/L — ABNORMAL HIGH (ref 0–44)
AST: 26 IU/L (ref 0–40)
Albumin/Globulin Ratio: 2.1 (ref 1.2–2.2)
Albumin: 4.6 g/dL (ref 4.0–5.0)
Alkaline Phosphatase: 99 IU/L (ref 44–121)
BUN/Creatinine Ratio: 12 (ref 9–20)
BUN: 13 mg/dL (ref 6–24)
Bilirubin Total: 0.7 mg/dL (ref 0.0–1.2)
CO2: 25 mmol/L (ref 20–29)
Calcium: 9.6 mg/dL (ref 8.7–10.2)
Chloride: 101 mmol/L (ref 96–106)
Creatinine, Ser: 1.07 mg/dL (ref 0.76–1.27)
GFR calc Af Amer: 99 mL/min/1.73 (ref >59)
GFR calc non Af Amer: 86 mL/min/1.73 (ref >59)
Globulin, Total: 2.2 g/dL (ref 1.5–4.5)
Glucose: 98 mg/dL (ref 65–99)
Potassium: 5 mmol/L (ref 3.5–5.2)
Sodium: 137 mmol/L (ref 134–144)
Total Protein: 6.8 g/dL (ref 6.0–8.5)

## 2020-08-08 LAB — LIPID PANEL
Chol/HDL Ratio: 4.9 ratio (ref 0.0–5.0)
Cholesterol, Total: 183 mg/dL (ref 100–199)
HDL: 37 mg/dL — ABNORMAL LOW (ref >39)
LDL Chol Calc (NIH): 131 mg/dL — ABNORMAL HIGH (ref 0–99)
Triglycerides: 78 mg/dL (ref 0–149)
VLDL Cholesterol Cal: 15 mg/dL (ref 5–40)

## 2020-08-08 LAB — TSH: TSH: 1.68 u[IU]/mL (ref 0.450–4.500)

## 2020-08-10 MED FILL — AMPHETAMINE-DEXTROAMPHETAMI: 15 | 30 days supply | Qty: 90 | Fill #0

## 2020-08-23 ENCOUNTER — Other Ambulatory Visit (HOSPITAL_COMMUNITY): Payer: Self-pay | Admitting: Psychiatry

## 2020-08-23 MED FILL — PROPRANOLOL 20 MG TABLET: 20 | 30 days supply | Qty: 60 | Fill #0

## 2020-08-23 MED FILL — CITALOPRAM HBR 20 MG TABLET: 20 | 30 days supply | Qty: 30 | Fill #0

## 2020-08-23 MED FILL — hydrOXYzine HCL 10 MG TABS: 10 | 30 days supply | Qty: 60 | Fill #0

## 2020-09-10 MED FILL — AMPHETAMINE-DEXTROAMPHETAMI: 15 | 30 days supply | Qty: 90 | Fill #0

## 2020-09-30 ENCOUNTER — Other Ambulatory Visit: Payer: Self-pay

## 2020-10-21 MED FILL — ROSUVASTATIN CALCIUM 10 MG: 10 | 90 days supply | Qty: 90 | Fill #1

## 2020-10-21 MED FILL — AMPHETAMINE-DEXTROAMPHETAMI: 15 | 30 days supply | Qty: 90 | Fill #0

## 2020-10-25 ENCOUNTER — Other Ambulatory Visit (HOSPITAL_COMMUNITY): Payer: Self-pay | Admitting: Psychiatry

## 2020-10-25 MED FILL — hydrOXYzine HCL 10 MG TABS: 10 | 30 days supply | Qty: 60 | Fill #0

## 2020-10-25 MED FILL — PROPRANOLOL 20 MG TABLET: 20 | 30 days supply | Qty: 60 | Fill #0

## 2020-10-25 MED FILL — CITALOPRAM HBR 20 MG TABLET: 20 | 30 days supply | Qty: 30 | Fill #0

## 2020-11-22 MED FILL — hydrOXYzine HCL 10 MG TABS: 10 | 30 days supply | Qty: 60 | Fill #1

## 2020-12-04 MED FILL — AMPHETAMINE-DEXTROAMPHETAMI: 15 | 30 days supply | Qty: 90 | Fill #0

## 2020-12-20 ENCOUNTER — Other Ambulatory Visit (HOSPITAL_COMMUNITY): Payer: Self-pay | Admitting: Psychiatry

## 2020-12-20 MED FILL — PROPRANOLOL 20 MG TABLET: 20 | 30 days supply | Qty: 60 | Fill #0

## 2020-12-20 MED FILL — hydrOXYzine HCL 10 MG TABS: 10 | 30 days supply | Qty: 60 | Fill #0

## 2020-12-20 MED FILL — CITALOPRAM HBR 20 MG TABLET: 20 | 30 days supply | Qty: 30 | Fill #0

## 2021-01-06 MED FILL — AMPHETAMINE-DEXTROAMPHETAMI: 15 | 30 days supply | Qty: 90 | Fill #0

## 2021-02-14 ENCOUNTER — Other Ambulatory Visit (HOSPITAL_COMMUNITY): Payer: Self-pay

## 2021-02-14 MED ORDER — AMPHETAMINE-DEXTROAMPHETAMINE 15 MG PO TABS
ORAL_TABLET | ORAL | 0 refills | Status: DC
Start: 2021-03-16 — End: 2022-05-20
  Filled 2021-03-26 (×2): qty 90, 30d supply, fill #0

## 2021-02-14 MED ORDER — PROPRANOLOL HCL 20 MG PO TABS
ORAL_TABLET | ORAL | 1 refills | Status: AC
  Filled 2021-02-14: qty 60, 30d supply, fill #0

## 2021-02-14 MED ORDER — AMPHETAMINE-DEXTROAMPHETAMINE 15 MG PO TABS
ORAL_TABLET | ORAL | 0 refills | Status: DC
Start: 2021-02-14 — End: 2022-05-20
  Filled 2021-02-14: qty 90, 30d supply, fill #0

## 2021-02-14 MED ORDER — HYDROXYZINE HCL 10 MG PO TABS
ORAL_TABLET | ORAL | 1 refills | Status: DC
Start: 2021-02-14 — End: 2022-05-20
  Filled 2021-02-14: qty 60, 30d supply, fill #0
  Filled 2021-06-17: qty 60, 30d supply, fill #1

## 2021-02-14 MED ORDER — CITALOPRAM HYDROBROMIDE 20 MG PO TABS
20.0000 mg | ORAL_TABLET | Freq: Every day | ORAL | 1 refills | Status: DC
Start: 2021-02-14 — End: 2022-05-20
  Filled 2021-02-14: qty 30, 30d supply, fill #0

## 2021-03-26 ENCOUNTER — Other Ambulatory Visit (HOSPITAL_COMMUNITY): Payer: Self-pay

## 2021-03-26 MED FILL — Rosuvastatin Calcium Tab 10 MG: ORAL | 90 days supply | Qty: 90 | Fill #0 | Status: AC

## 2021-03-27 ENCOUNTER — Other Ambulatory Visit (HOSPITAL_COMMUNITY): Payer: Self-pay

## 2021-03-28 ENCOUNTER — Other Ambulatory Visit (HOSPITAL_COMMUNITY): Payer: Self-pay

## 2021-04-08 ENCOUNTER — Other Ambulatory Visit (HOSPITAL_COMMUNITY): Payer: Self-pay

## 2021-04-08 MED ORDER — CITALOPRAM HYDROBROMIDE 20 MG PO TABS
20.0000 mg | ORAL_TABLET | Freq: Every day | ORAL | 1 refills | Status: DC
Start: 2021-04-08 — End: 2022-05-20
  Filled 2021-04-08: qty 30, 30d supply, fill #0

## 2021-04-08 MED ORDER — HYDROXYZINE HCL 10 MG PO TABS
ORAL_TABLET | ORAL | 1 refills | Status: DC
Start: 2021-04-08 — End: 2022-05-20
  Filled 2021-04-08: qty 60, 30d supply, fill #0

## 2021-04-08 MED ORDER — PROPRANOLOL HCL 20 MG PO TABS
ORAL_TABLET | ORAL | 1 refills | Status: DC
Start: 2021-04-08 — End: 2022-05-20
  Filled 2021-04-08: qty 60, 30d supply, fill #0

## 2021-04-08 MED ORDER — AMPHETAMINE-DEXTROAMPHETAMINE 15 MG PO TABS
ORAL_TABLET | ORAL | 0 refills | Status: DC
Start: 2021-04-08 — End: 2022-05-20
  Filled 2021-05-08: qty 90, 30d supply, fill #0

## 2021-04-16 ENCOUNTER — Other Ambulatory Visit (HOSPITAL_COMMUNITY): Payer: Self-pay

## 2021-04-21 ENCOUNTER — Other Ambulatory Visit (HOSPITAL_COMMUNITY): Payer: Self-pay

## 2021-04-24 ENCOUNTER — Other Ambulatory Visit (HOSPITAL_COMMUNITY): Payer: Self-pay

## 2021-04-26 ENCOUNTER — Other Ambulatory Visit (HOSPITAL_COMMUNITY): Payer: Self-pay

## 2021-04-28 ENCOUNTER — Other Ambulatory Visit (HOSPITAL_COMMUNITY): Payer: Self-pay

## 2021-05-01 ENCOUNTER — Other Ambulatory Visit (HOSPITAL_COMMUNITY): Payer: Self-pay

## 2021-05-08 ENCOUNTER — Other Ambulatory Visit (HOSPITAL_COMMUNITY): Payer: Self-pay

## 2021-05-13 ENCOUNTER — Other Ambulatory Visit (HOSPITAL_COMMUNITY): Payer: Self-pay

## 2021-06-17 ENCOUNTER — Other Ambulatory Visit (HOSPITAL_COMMUNITY): Payer: Self-pay

## 2021-06-17 MED FILL — Amphetamine-Dextroamphetamine Tab 15 MG: ORAL | 30 days supply | Qty: 90 | Fill #0 | Status: AC

## 2021-06-24 ENCOUNTER — Other Ambulatory Visit (HOSPITAL_COMMUNITY): Payer: Self-pay

## 2021-06-24 MED ORDER — HYDROXYZINE HCL 10 MG PO TABS
ORAL_TABLET | ORAL | 1 refills | Status: DC
Start: 2021-06-24 — End: 2022-05-20
  Filled 2021-06-24: qty 60, 30d supply, fill #0

## 2021-06-24 MED ORDER — CITALOPRAM HYDROBROMIDE 20 MG PO TABS
ORAL_TABLET | ORAL | 1 refills | Status: DC
Start: 2021-06-24 — End: 2022-05-20
  Filled 2021-06-24: qty 30, 30d supply, fill #0

## 2021-06-24 MED ORDER — AMPHETAMINE-DEXTROAMPHETAMINE 15 MG PO TABS
ORAL_TABLET | ORAL | 0 refills | Status: DC
Start: 2021-06-24 — End: 2022-05-20
  Filled 2021-06-24 – 2021-07-17 (×2): qty 90, 30d supply, fill #0

## 2021-06-24 MED ORDER — AMPHETAMINE-DEXTROAMPHETAMINE 15 MG PO TABS
ORAL_TABLET | ORAL | 0 refills | Status: DC
Start: 2021-07-24 — End: 2022-05-20
  Filled 2021-09-29: qty 90, 30d supply, fill #0

## 2021-06-24 MED ORDER — PROPRANOLOL HCL 20 MG PO TABS
ORAL_TABLET | ORAL | 1 refills | Status: DC
Start: 2021-06-24 — End: 2022-05-20
  Filled 2021-06-24: qty 60, 30d supply, fill #0

## 2021-07-03 ENCOUNTER — Other Ambulatory Visit (HOSPITAL_COMMUNITY): Payer: Self-pay

## 2021-07-17 ENCOUNTER — Other Ambulatory Visit (HOSPITAL_COMMUNITY): Payer: Self-pay

## 2021-08-20 ENCOUNTER — Other Ambulatory Visit (HOSPITAL_COMMUNITY): Payer: Self-pay

## 2021-08-20 MED ORDER — PROPRANOLOL HCL 20 MG PO TABS
ORAL_TABLET | ORAL | 1 refills | Status: DC
Start: 2021-08-20 — End: 2022-05-20
  Filled 2021-08-20: qty 60, 30d supply, fill #0

## 2021-08-20 MED ORDER — CITALOPRAM HYDROBROMIDE 20 MG PO TABS
ORAL_TABLET | ORAL | 1 refills | Status: DC
Start: 2021-08-20 — End: 2022-05-20
  Filled 2021-08-20: qty 30, 30d supply, fill #0

## 2021-08-20 MED ORDER — AMPHETAMINE-DEXTROAMPHETAMINE 15 MG PO TABS
ORAL_TABLET | ORAL | 0 refills | Status: DC
Start: 2021-08-20 — End: 2022-05-20
  Filled 2021-08-20: qty 90, 30d supply, fill #0

## 2021-08-20 MED ORDER — AMPHETAMINE-DEXTROAMPHETAMINE 15 MG PO TABS
ORAL_TABLET | ORAL | 0 refills | Status: DC
Start: 2021-09-19 — End: 2022-05-20
  Filled 2021-12-06: qty 90, 30d supply, fill #0

## 2021-08-20 MED ORDER — HYDROXYZINE HCL 10 MG PO TABS
ORAL_TABLET | ORAL | 1 refills | Status: AC
  Filled 2021-08-20: qty 60, 30d supply, fill #0
  Filled 2022-02-03: qty 60, 30d supply, fill #1

## 2021-08-21 ENCOUNTER — Other Ambulatory Visit: Payer: Self-pay | Admitting: Cardiovascular Disease

## 2021-08-21 ENCOUNTER — Other Ambulatory Visit (HOSPITAL_COMMUNITY): Payer: Self-pay

## 2021-08-21 MED ORDER — ROSUVASTATIN CALCIUM 10 MG PO TABS
10.0000 mg | ORAL_TABLET | Freq: Every day | ORAL | 0 refills | Status: DC
Start: 2021-08-21 — End: 2022-03-11
  Filled 2021-08-21: qty 30, 30d supply, fill #0

## 2021-09-29 ENCOUNTER — Other Ambulatory Visit (HOSPITAL_COMMUNITY): Payer: Self-pay

## 2021-10-15 ENCOUNTER — Other Ambulatory Visit (HOSPITAL_COMMUNITY): Payer: Self-pay

## 2021-10-15 MED ORDER — PROPRANOLOL HCL 20 MG PO TABS
ORAL_TABLET | ORAL | 1 refills | Status: DC
  Filled 2021-10-15: qty 60, 30d supply, fill #0

## 2021-10-15 MED ORDER — AMPHETAMINE-DEXTROAMPHETAMINE 15 MG PO TABS
ORAL_TABLET | ORAL | 0 refills | Status: DC
  Filled 2021-11-04: qty 90, 30d supply, fill #0

## 2021-10-15 MED ORDER — AMPHETAMINE-DEXTROAMPHETAMINE 15 MG PO TABS: ORAL_TABLET | ORAL | 0 refills | Status: DC

## 2021-10-15 MED ORDER — HYDROXYZINE HCL 10 MG PO TABS
ORAL_TABLET | ORAL | 1 refills | Status: DC
  Filled 2021-10-15: qty 60, 30d supply, fill #0

## 2021-10-15 MED ORDER — CITALOPRAM HYDROBROMIDE 20 MG PO TABS
20.0000 mg | ORAL_TABLET | Freq: Every day | ORAL | 1 refills | Status: DC
  Filled 2021-10-15: qty 30, 30d supply, fill #0

## 2021-10-24 ENCOUNTER — Other Ambulatory Visit (HOSPITAL_COMMUNITY): Payer: Self-pay

## 2021-11-04 ENCOUNTER — Other Ambulatory Visit (HOSPITAL_COMMUNITY): Payer: Self-pay

## 2021-11-18 ENCOUNTER — Other Ambulatory Visit (HOSPITAL_COMMUNITY): Payer: Self-pay

## 2021-12-01 ENCOUNTER — Other Ambulatory Visit (HOSPITAL_COMMUNITY): Payer: Self-pay

## 2021-12-01 MED ORDER — HYDROXYZINE HCL 10 MG PO TABS
ORAL_TABLET | ORAL | 1 refills | Status: DC
  Filled 2021-12-01: qty 60, 30d supply, fill #0

## 2021-12-01 MED ORDER — AMPHETAMINE-DEXTROAMPHETAMINE 15 MG PO TABS
ORAL_TABLET | ORAL | 0 refills | Status: DC
  Filled 2022-02-03: qty 90, 30d supply, fill #0

## 2021-12-01 MED ORDER — PROPRANOLOL HCL 20 MG PO TABS
ORAL_TABLET | ORAL | 1 refills | Status: DC
  Filled 2021-12-01: qty 60, 30d supply, fill #0

## 2021-12-01 MED ORDER — CITALOPRAM HYDROBROMIDE 20 MG PO TABS
ORAL_TABLET | ORAL | 1 refills | Status: DC
  Filled 2021-12-01: qty 30, 30d supply, fill #0

## 2021-12-01 MED ORDER — AMPHETAMINE-DEXTROAMPHETAMINE 15 MG PO TABS
ORAL_TABLET | ORAL | 0 refills | Status: DC
  Filled 2022-01-06: qty 90, 30d supply, fill #0

## 2021-12-06 ENCOUNTER — Other Ambulatory Visit (HOSPITAL_COMMUNITY): Payer: Self-pay

## 2022-01-06 ENCOUNTER — Other Ambulatory Visit (HOSPITAL_COMMUNITY): Payer: Self-pay

## 2022-02-03 ENCOUNTER — Other Ambulatory Visit (HOSPITAL_COMMUNITY): Payer: Self-pay

## 2022-02-09 ENCOUNTER — Other Ambulatory Visit (HOSPITAL_COMMUNITY): Payer: Self-pay

## 2022-02-09 MED ORDER — AMPHETAMINE-DEXTROAMPHETAMINE 15 MG PO TABS
ORAL_TABLET | ORAL | 0 refills | Status: DC
  Filled 2022-03-17: qty 90, 30d supply, fill #0

## 2022-02-09 MED ORDER — CITALOPRAM HYDROBROMIDE 20 MG PO TABS
ORAL_TABLET | ORAL | 1 refills | Status: DC
  Filled 2022-02-09: qty 30, 30d supply, fill #0

## 2022-02-09 MED ORDER — AMPHETAMINE-DEXTROAMPHETAMINE 15 MG PO TABS
ORAL_TABLET | ORAL | 0 refills | Status: DC
  Filled 2022-02-09: qty 90, 30d supply, fill #0

## 2022-02-09 MED ORDER — PROPRANOLOL HCL 20 MG PO TABS
ORAL_TABLET | ORAL | 1 refills | Status: DC
  Filled 2022-02-09: qty 60, 30d supply, fill #0

## 2022-02-09 MED ORDER — HYDROXYZINE HCL 10 MG PO TABS
ORAL_TABLET | ORAL | 1 refills | Status: DC
  Filled 2022-02-09 – 2022-03-17 (×2): qty 60, 30d supply, fill #0

## 2022-02-18 ENCOUNTER — Other Ambulatory Visit (HOSPITAL_COMMUNITY): Payer: Self-pay

## 2022-03-02 ENCOUNTER — Emergency Department (HOSPITAL_BASED_OUTPATIENT_CLINIC_OR_DEPARTMENT_OTHER): Payer: No Typology Code available for payment source

## 2022-03-02 ENCOUNTER — Encounter (HOSPITAL_BASED_OUTPATIENT_CLINIC_OR_DEPARTMENT_OTHER): Payer: Self-pay

## 2022-03-02 ENCOUNTER — Emergency Department (HOSPITAL_BASED_OUTPATIENT_CLINIC_OR_DEPARTMENT_OTHER)
Admission: EM | Admit: 2022-03-02 | Discharge: 2022-03-02 | Disposition: A | Discharge status: Home / Self Care | Payer: No Typology Code available for payment source | Attending: Emergency Medicine | Admitting: Emergency Medicine

## 2022-03-02 ENCOUNTER — Emergency Department (HOSPITAL_BASED_OUTPATIENT_CLINIC_OR_DEPARTMENT_OTHER): Payer: No Typology Code available for payment source | Admitting: Radiology

## 2022-03-02 ENCOUNTER — Other Ambulatory Visit: Payer: Self-pay

## 2022-03-02 ENCOUNTER — Other Ambulatory Visit (HOSPITAL_COMMUNITY): Payer: Self-pay

## 2022-03-02 DIAGNOSIS — S40811A Abrasion of right upper arm, initial encounter: Secondary | ICD-10-CM | POA: Insufficient documentation

## 2022-03-02 DIAGNOSIS — M25471 Effusion, right ankle: Secondary | ICD-10-CM | POA: Diagnosis not present

## 2022-03-02 DIAGNOSIS — S301XXA Contusion of abdominal wall, initial encounter: Secondary | ICD-10-CM

## 2022-03-02 DIAGNOSIS — M25511 Pain in right shoulder: Secondary | ICD-10-CM | POA: Diagnosis not present

## 2022-03-02 DIAGNOSIS — Y92410 Unspecified street and highway as the place of occurrence of the external cause: Secondary | ICD-10-CM | POA: Diagnosis not present

## 2022-03-02 DIAGNOSIS — S39012A Strain of muscle, fascia and tendon of lower back, initial encounter: Secondary | ICD-10-CM

## 2022-03-02 DIAGNOSIS — M545 Low back pain, unspecified: Secondary | ICD-10-CM | POA: Diagnosis not present

## 2022-03-02 DIAGNOSIS — R1031 Right lower quadrant pain: Secondary | ICD-10-CM | POA: Insufficient documentation

## 2022-03-02 DIAGNOSIS — M79631 Pain in right forearm: Secondary | ICD-10-CM | POA: Diagnosis not present

## 2022-03-02 DIAGNOSIS — S4991XA Unspecified injury of right shoulder and upper arm, initial encounter: Secondary | ICD-10-CM | POA: Diagnosis present

## 2022-03-02 LAB — BASIC METABOLIC PANEL
Anion gap: 11 (ref 5–15)
BUN: 15 mg/dL (ref 6–20)
CO2: 24 mmol/L (ref 22–32)
Calcium: 9.2 mg/dL (ref 8.9–10.3)
Chloride: 107 mmol/L (ref 98–111)
Creatinine, Ser: 0.97 mg/dL (ref 0.61–1.24)
GFR, Estimated: >60 mL/min (ref >60)
Glucose, Bld: 91 mg/dL (ref 70–99)
Potassium: 3.9 mmol/L (ref 3.5–5.1)
Sodium: 142 mmol/L (ref 135–145)

## 2022-03-02 MED ORDER — IBUPROFEN 600 MG PO TABS
600.0000 mg | ORAL_TABLET | Freq: Four times a day (QID) | ORAL | 0 refills | Status: AC | PRN
  Filled 2022-03-02: qty 30, 8d supply, fill #0

## 2022-03-02 MED ORDER — CYCLOBENZAPRINE HCL 10 MG PO TABS
10.0000 mg | ORAL_TABLET | Freq: Two times a day (BID) | ORAL | 0 refills | Status: AC | PRN
  Filled 2022-03-02 – 2022-03-17 (×2): qty 20, 10d supply, fill #0

## 2022-03-02 MED ORDER — OXYCODONE-ACETAMINOPHEN 5-325 MG PO TABS
1.0000 | ORAL_TABLET | Freq: Once | ORAL | Status: AC
  Administered 2022-03-02: 1 via ORAL
  Filled 2022-03-02: qty 1

## 2022-03-02 MED ORDER — IBUPROFEN 800 MG PO TABS
800.0000 mg | ORAL_TABLET | Freq: Once | ORAL | Status: AC
  Administered 2022-03-02: 800 mg via ORAL
  Filled 2022-03-02: qty 1

## 2022-03-02 MED ORDER — IOHEXOL 300 MG/ML  SOLN
100.0000 mL | Freq: Once | INTRAMUSCULAR | Status: AC | PRN
  Administered 2022-03-02: 100 mL via INTRAVENOUS

## 2022-03-02 MED ORDER — BACITRACIN ZINC 500 UNIT/GM EX OINT
TOPICAL_OINTMENT | Freq: Two times a day (BID) | CUTANEOUS | Status: DC
  Administered 2022-03-02: 31.5 via TOPICAL
  Filled 2022-03-02: qty 28.35
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
## 2022-03-02 NOTE — ED Notes (Signed)
Pt verbalizes understanding of discharge instructions. Opportunity for questioning and answers were provided. Pt discharged from ED to home with family.    
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
## 2022-03-03 ENCOUNTER — Other Ambulatory Visit (HOSPITAL_COMMUNITY): Payer: Self-pay

## 2022-03-11 ENCOUNTER — Ambulatory Visit (INDEPENDENT_AMBULATORY_CARE_PROVIDER_SITE_OTHER): Payer: No Typology Code available for payment source | Admitting: Emergency Medicine

## 2022-03-11 ENCOUNTER — Encounter: Payer: Self-pay | Admitting: Emergency Medicine

## 2022-03-11 ENCOUNTER — Other Ambulatory Visit (HOSPITAL_COMMUNITY): Payer: Self-pay

## 2022-03-11 VITALS — BP 124/78 | HR 108 | Temp 97.8°F | Ht 71.5 in | Wt 213.2 lb

## 2022-03-11 DIAGNOSIS — S060X0A Concussion without loss of consciousness, initial encounter: Secondary | ICD-10-CM | POA: Diagnosis not present

## 2022-03-11 MED ORDER — ROSUVASTATIN CALCIUM 10 MG PO TABS
10.0000 mg | ORAL_TABLET | Freq: Every day | ORAL | 0 refills | Status: AC
  Filled 2022-03-11: qty 90, 90d supply, fill #0
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
## 2022-03-11 NOTE — Patient Instructions (Signed)
Concussion, Adult  A concussion is a brain injury from a hard, direct hit (trauma) to your head or body. This direct hit causes your brain to quickly shake back and forth inside your skull. A concussion may also be called a mild traumatic brain injury (TBI). Healing from this injury can take time. What are the causes? This condition is caused by: A direct hit to your head, such as: Running into a player during a game. Being hit in a fight. Hitting your head on a hard surface. A quick and sudden movement of the head or neck, such as in a car crash. What are the signs or symptoms? The signs of a concussion can be hard to notice. They may be missed by you, family members, and doctors. You may look fine on the outside but may not act or feel normal. Physical symptoms Headaches. Being dizzy. Problems with body balance. Being sensitive to light or noise. Vomiting or feeling like you may vomit. Being tired. Problems seeing or hearing. Not sleeping or eating as you used to. Seizure. Mental and emotional symptoms Feeling grouchy (irritable). Having mood changes. Problems remembering things. Trouble focusing your mind (concentrating), organizing, or making decisions. Being slow to think, act, react, speak, or read. Feeling worried or nervous (anxious). Feeling sad (depressed). How is this treated? This condition may be treated by: Stopping sports or activity if you are injured. If you hit your head or have signs of concussion: Do not return to sports or activities the same day. Get checked by a doctor before you return to your activities. Resting your body and your mind. Being watched carefully, often at home. Medicines to help with symptoms such as: Headaches. Feeling like you may vomit. Problems with sleep. Avoiding alcohol and drugs. Being asked to go to a concussion clinic or a place to help you recover (rehabilitation center). Recovery from a concussion can take time. Return to  activities only: When you are fully healed. When your doctor says it is safe. Avoid taking strong pain medicines (opioids) for a concussion. Follow these instructions at home: Activity Limit activities that need a lot of thought or focus, such as: Homework or work for your job. Watching TV. Using the computer or phone. Playing memory games and puzzles. Rest. Rest helps your brain heal. Make sure you: Get plenty of sleep. Most adults should get 7-9 hours of sleep each night. Rest during the day. Take naps or breaks when you feel tired. Avoid activity like exercise until your doctor says its safe. Stop any activity that makes symptoms worse. Do not do activities that could cause a second concussion, such as riding a bike or playing sports. Ask your doctor when you can return to your normal activities, such as school, work, sports, and driving. Your ability to react may be slower. Do not do these activities if you are dizzy. General instructions  Take over-the-counter and prescription medicines only as told by your doctor. Do not drink alcohol until your doctor says you can. Watch your symptoms and tell other people to do the same. Other problems can occur after a concussion. Older adults have a higher risk of serious problems. Tell your work manager, teachers, school nurse, school counselor, coach, or athletic trainer about your injury and symptoms. Tell them about what you can or cannot do. Keep all follow-up visits as told by your doctor. This is important. How is this prevented? It is very important that you do not get another brain injury. In   rare cases, another injury can cause brain damage that will not go away, brain swelling, or death. The risk of this is greatest in the first 7-10 days after a head injury. To avoid injuries: Stop activities that could lead to a second concussion, such as contact sports, until your doctor says it is okay. When you return to sports or activities: Do  not crash into other players. This is how most concussions happen. Follow the rules. Respect other players. Do not engage in violent behavior while playing. Get regular exercise. Do strength and balance training. Wear a helmet that fits you well during sports, biking, or other activities. Helmets can help protect you from serious skull and brain injuries, but they do not protect you from a concussion. Even when wearing a helmet, you should avoid being hit in the head. Contact a doctor if: Your symptoms do not get better. You have new symptoms. You have another injury. Get help right away if: You have bad headaches or your headaches get worse. You feel weak or numb in any part of your body. You feel mixed up (confused). Your balance gets worse. You vomit often. You feel more sleepy than normal. You cannot speak well, or have slurred speech. You have a seizure. Others have trouble waking you up. You have changes in how you act. You have changes in how you see (vision). You pass out (lose consciousness). These symptoms may be an emergency. Do not wait to see if the symptoms will go away. Get medical help right away. Call your local emergency services (911 in the [place] Do not drive yourself to the hospital. Summary A concussion is a brain injury from a hard, direct hit (trauma) to your head or body. This condition is treated with rest and careful watching of symptoms. Ask your doctor when you can return to your normal activities, such as school, work, or driving. Get help right away if you have a very bad headache, feel weak in any part of your body, have a seizure, have changes in how you act or see, or if you are mixed up or more sleepy than normal. This information is not intended to replace advice given to you by your health care provider. Make sure you discuss any questions you have with your health care provider. Document Revised: 12/12/2020 Document Reviewed: 12/12/2020 Elsevier  Patient Education  2023 Elsevier Inc.  

## 2022-03-12 ENCOUNTER — Other Ambulatory Visit (HOSPITAL_COMMUNITY): Payer: Self-pay

## 2022-03-17 ENCOUNTER — Other Ambulatory Visit (HOSPITAL_COMMUNITY): Payer: Self-pay

## 2022-04-01 ENCOUNTER — Other Ambulatory Visit (HOSPITAL_COMMUNITY): Payer: Self-pay

## 2022-04-01 MED ORDER — PROPRANOLOL HCL 20 MG PO TABS
ORAL_TABLET | ORAL | 1 refills | Status: DC
  Filled 2022-04-01: qty 60, 30d supply, fill #0

## 2022-04-01 MED ORDER — AMPHETAMINE-DEXTROAMPHETAMINE 15 MG PO TABS
ORAL_TABLET | ORAL | 0 refills | Status: DC
  Filled 2022-04-01 – 2022-04-22 (×2): qty 90, 30d supply, fill #0

## 2022-04-01 MED ORDER — AMPHETAMINE-DEXTROAMPHETAMINE 15 MG PO TABS
ORAL_TABLET | ORAL | 0 refills | Status: DC
  Filled 2022-05-29: qty 90, 30d supply, fill #0

## 2022-04-01 MED ORDER — HYDROXYZINE HCL 10 MG PO TABS
ORAL_TABLET | ORAL | 1 refills | Status: DC
  Filled 2022-04-01: qty 60, 30d supply, fill #0

## 2022-04-01 MED ORDER — CITALOPRAM HYDROBROMIDE 20 MG PO TABS
ORAL_TABLET | ORAL | 1 refills | Status: DC
  Filled 2022-04-01: qty 30, 30d supply, fill #0

## 2022-04-11 ENCOUNTER — Other Ambulatory Visit (HOSPITAL_COMMUNITY): Payer: Self-pay

## 2022-04-22 ENCOUNTER — Other Ambulatory Visit (HOSPITAL_COMMUNITY): Payer: Self-pay

## 2022-05-20 ENCOUNTER — Ambulatory Visit (INDEPENDENT_AMBULATORY_CARE_PROVIDER_SITE_OTHER): Payer: No Typology Code available for payment source | Admitting: Emergency Medicine

## 2022-05-20 ENCOUNTER — Encounter: Payer: Self-pay | Admitting: Emergency Medicine

## 2022-05-20 VITALS — BP 122/84 | HR 95 | Temp 98.2°F | Ht 71.5 in | Wt 205.5 lb

## 2022-05-20 DIAGNOSIS — Z13228 Encounter for screening for other metabolic disorders: Secondary | ICD-10-CM

## 2022-05-20 DIAGNOSIS — Z130 Encounter for screening for diseases of the blood and blood-forming organs and certain disorders involving the immune mechanism: Secondary | ICD-10-CM | POA: Diagnosis not present

## 2022-05-20 DIAGNOSIS — Z1329 Encounter for screening for other suspected endocrine disorder: Secondary | ICD-10-CM | POA: Diagnosis not present

## 2022-05-20 DIAGNOSIS — Z13220 Encounter for screening for lipoid disorders: Secondary | ICD-10-CM | POA: Diagnosis not present

## 2022-05-20 DIAGNOSIS — Z87820 Personal history of traumatic brain injury: Secondary | ICD-10-CM

## 2022-05-20 DIAGNOSIS — Z0000 Encounter for general adult medical examination without abnormal findings: Secondary | ICD-10-CM

## 2022-05-20 DIAGNOSIS — S99911D Unspecified injury of right ankle, subsequent encounter: Secondary | ICD-10-CM

## 2022-05-20 DIAGNOSIS — Z1159 Encounter for screening for other viral diseases: Secondary | ICD-10-CM | POA: Diagnosis not present

## 2022-05-20 LAB — LIPID PANEL
Cholesterol: 220 mg/dL — ABNORMAL HIGH (ref 0–200)
HDL: 44 mg/dL (ref >39.00)
LDL Cholesterol: 160 mg/dL — ABNORMAL HIGH (ref 0–99)
NonHDL: 175.69
Total CHOL/HDL Ratio: 5
Triglycerides: 76 mg/dL (ref 0.0–149.0)
VLDL: 15.2 mg/dL (ref 0.0–40.0)

## 2022-05-20 LAB — COMPREHENSIVE METABOLIC PANEL
ALT: 45 U/L (ref 0–53)
AST: 19 U/L (ref 0–37)
Albumin: 4.7 g/dL (ref 3.5–5.2)
Alkaline Phosphatase: 81 U/L (ref 39–117)
BUN: 11 mg/dL (ref 6–23)
CO2: 28 mEq/L (ref 19–32)
Calcium: 9.8 mg/dL (ref 8.4–10.5)
Chloride: 103 mEq/L (ref 96–112)
Creatinine, Ser: 1.06 mg/dL (ref 0.40–1.50)
GFR: 86.18 mL/min (ref >60.00)
Glucose, Bld: 87 mg/dL (ref 70–99)
Potassium: 4.6 mEq/L (ref 3.5–5.1)
Sodium: 138 mEq/L (ref 135–145)
Total Bilirubin: 1.2 mg/dL (ref 0.2–1.2)
Total Protein: 7.6 g/dL (ref 6.0–8.3)

## 2022-05-20 LAB — CBC WITH DIFFERENTIAL/PLATELET
Basophils Absolute: 0 10*3/uL (ref 0.0–0.1)
Basophils Relative: 0.7 % (ref 0.0–3.0)
Eosinophils Absolute: 0.2 10*3/uL (ref 0.0–0.7)
Eosinophils Relative: 2.4 % (ref 0.0–5.0)
HCT: 50.9 % (ref 39.0–52.0)
Hemoglobin: 16.9 g/dL (ref 13.0–17.0)
Lymphocytes Relative: 30.2 % (ref 12.0–46.0)
Lymphs Abs: 2 10*3/uL (ref 0.7–4.0)
MCHC: 33.2 g/dL (ref 30.0–36.0)
MCV: 89.4 fl (ref 78.0–100.0)
Monocytes Absolute: 0.5 10*3/uL (ref 0.1–1.0)
Monocytes Relative: 7.2 % (ref 3.0–12.0)
Neutro Abs: 3.9 10*3/uL (ref 1.4–7.7)
Neutrophils Relative %: 59.5 % (ref 43.0–77.0)
Platelets: 304 10*3/uL (ref 150.0–400.0)
RBC: 5.69 Mil/uL (ref 4.22–5.81)
RDW: 13.2 % (ref 11.5–15.5)
WBC: 6.6 10*3/uL (ref 4.0–10.5)

## 2022-05-20 LAB — HEMOGLOBIN A1C: Hgb A1c MFr Bld: 5.6 % (ref 4.6–6.5)
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
## 2022-05-20 NOTE — Patient Instructions (Signed)
Health Maintenance, Male Adopting a healthy lifestyle and getting preventive care are important in promoting health and wellness. Ask your health care provider about: The right schedule for you to have regular tests and exams. Things you can do on your own to prevent diseases and keep yourself healthy. What should I know about diet, weight, and exercise? Eat a healthy diet  Eat a diet that includes plenty of vegetables, fruits, low-fat dairy products, and lean protein. Do not eat a lot of foods that are high in solid fats, added sugars, or sodium. Maintain a healthy weight Body mass index (BMI) is a measurement that can be used to identify possible weight problems. It estimates body fat based on height and weight. Your health care provider can help determine your BMI and help you achieve or maintain a healthy weight. Get regular exercise Get regular exercise. This is one of the most important things you can do for your health. Most adults should: Exercise for at least 150 minutes each week. The exercise should increase your heart rate and make you sweat (moderate-intensity exercise). Do strengthening exercises at least twice a week. This is in addition to the moderate-intensity exercise. Spend less time sitting. Even light physical activity can be beneficial. Watch cholesterol and blood lipids Have your blood tested for lipids and cholesterol at 43 years of age, then have this test every 5 years. You may need to have your cholesterol levels checked more often if: Your lipid or cholesterol levels are high. You are older than 43 years of age. You are at high risk for heart disease. What should I know about cancer screening? Many types of cancers can be detected early and may often be prevented. Depending on your health history and family history, you may need to have cancer screening at various ages. This may include screening for: Colorectal cancer. Prostate cancer. Skin cancer. Lung  cancer. What should I know about heart disease, diabetes, and high blood pressure? Blood pressure and heart disease High blood pressure causes heart disease and increases the risk of stroke. This is more likely to develop in people who have high blood pressure readings or are overweight. Talk with your health care provider about your target blood pressure readings. Have your blood pressure checked: Every 3-5 years if you are 18-39 years of age. Every year if you are 40 years old or older. If you are between the ages of 65 and 75 and are a current or former smoker, ask your health care provider if you should have a one-time screening for abdominal aortic aneurysm (AAA). Diabetes Have regular diabetes screenings. This checks your fasting blood sugar level. Have the screening done: Once every three years after age 45 if you are at a normal weight and have a low risk for diabetes. More often and at a younger age if you are overweight or have a high risk for diabetes. What should I know about preventing infection? Hepatitis B If you have a higher risk for hepatitis B, you should be screened for this virus. Talk with your health care provider to find out if you are at risk for hepatitis B infection. Hepatitis C Blood testing is recommended for: Everyone born from 1945 through 1965. Anyone with known risk factors for hepatitis C. Sexually transmitted infections (STIs) You should be screened each year for STIs, including gonorrhea and chlamydia, if: You are sexually active and are younger than 43 years of age. You are older than 43 years of age and your   health care provider tells you that you are at risk for this type of infection. Your sexual activity has changed since you were last screened, and you are at increased risk for chlamydia or gonorrhea. Ask your health care provider if you are at risk. Ask your health care provider about whether you are at high risk for HIV. Your health care provider  may recommend a prescription medicine to help prevent HIV infection. If you choose to take medicine to prevent HIV, you should first get tested for HIV. You should then be tested every 3 months for as long as you are taking the medicine. Follow these instructions at home: Alcohol use Do not drink alcohol if your health care provider tells you not to drink. If you drink alcohol: Limit how much you have to 0-2 drinks a day. Know how much alcohol is in your drink. In the [place] one drink equals one 12 oz bottle of beer (355 mL), one 5 oz glass of wine (148 mL), or one 1 oz glass of hard liquor (44 mL). Lifestyle Do not use any products that contain nicotine or tobacco. These products include cigarettes, chewing tobacco, and vaping devices, such as e-cigarettes. If you need help quitting, ask your health care provider. Do not use street drugs. Do not share needles. Ask your health care provider for help if you need support or information about quitting drugs. General instructions Schedule regular health, dental, and eye exams. Stay current with your vaccines. Tell your health care provider if: You often feel depressed. You have ever been abused or do not feel safe at home. Summary Adopting a healthy lifestyle and getting preventive care are important in promoting health and wellness. Follow your health care provider's instructions about healthy diet, exercising, and getting tested or screened for diseases. Follow your health care provider's instructions on monitoring your cholesterol and blood pressure. This information is not intended to replace advice given to you by your health care provider. Make sure you discuss any questions you have with your health care provider. Document Revised: 02/17/2021 Document Reviewed: 02/17/2021 Elsevier Patient Education  2023 Elsevier Inc.  

## 2022-05-21 LAB — HEPATITIS C ANTIBODY: Hepatitis C Ab: NONREACTIVE
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
## 2022-05-25 ENCOUNTER — Ambulatory Visit: Payer: Self-pay

## 2022-05-25 ENCOUNTER — Ambulatory Visit (INDEPENDENT_AMBULATORY_CARE_PROVIDER_SITE_OTHER): Payer: No Typology Code available for payment source | Admitting: Family Medicine

## 2022-05-25 ENCOUNTER — Ambulatory Visit (INDEPENDENT_AMBULATORY_CARE_PROVIDER_SITE_OTHER): Payer: No Typology Code available for payment source

## 2022-05-25 VITALS — BP 118/68 | HR 117 | Ht 71.0 in | Wt 205.0 lb

## 2022-05-25 DIAGNOSIS — M25571 Pain in right ankle and joints of right foot: Secondary | ICD-10-CM | POA: Diagnosis not present

## 2022-05-25 DIAGNOSIS — G8929 Other chronic pain: Secondary | ICD-10-CM

## 2022-05-25 NOTE — Patient Instructions (Addendum)
Good to see you   Please get an Xray today before you leave   I've referred you to Physical Therapy.  Let us know if you don't hear from them in one week.   Recheck in 6 weeks.   I am happy to talk with your wife. Just send me a mychart. I am usually free at 7 and noon. We can set up a time.
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
## 2022-05-29 ENCOUNTER — Other Ambulatory Visit (HOSPITAL_COMMUNITY): Payer: Self-pay

## 2022-06-03 ENCOUNTER — Encounter: Payer: Self-pay | Admitting: Neurology

## 2022-06-03 ENCOUNTER — Other Ambulatory Visit (HOSPITAL_COMMUNITY): Payer: Self-pay

## 2022-06-03 ENCOUNTER — Ambulatory Visit: Payer: No Typology Code available for payment source | Admitting: Neurology

## 2022-06-03 VITALS — BP 110/76 | HR 98 | Ht 72.0 in | Wt 206.0 lb

## 2022-06-03 DIAGNOSIS — S060XAD Concussion with loss of consciousness status unknown, subsequent encounter: Secondary | ICD-10-CM

## 2022-06-03 NOTE — Patient Instructions (Addendum)
MRI Brain without contrast  Continue current medications  Follow up with your doctors  Consider sleep study if you continue to experience daytime fatigue  Return as needed
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
## 2022-06-04 ENCOUNTER — Other Ambulatory Visit (HOSPITAL_COMMUNITY): Payer: Self-pay

## 2022-06-04 MED ORDER — ONDANSETRON 8 MG PO TBDP
ORAL_TABLET | ORAL | 0 refills | Status: AC
  Filled 2022-06-04: qty 30, 10d supply, fill #0

## 2022-06-04 MED ORDER — WEGOVY 0.25 MG/0.5ML ~~LOC~~ SOAJ
SUBCUTANEOUS | 0 refills | Status: AC
  Filled 2022-06-04: qty 2, 28d supply, fill #0

## 2022-06-16 ENCOUNTER — Encounter: Payer: Self-pay | Admitting: Cardiovascular Disease

## 2022-06-18 ENCOUNTER — Ambulatory Visit: Payer: No Typology Code available for payment source | Admitting: Physical Therapy

## 2022-06-25 ENCOUNTER — Other Ambulatory Visit (HOSPITAL_COMMUNITY): Payer: Self-pay

## 2022-06-25 ENCOUNTER — Telehealth: Payer: Self-pay | Admitting: Neurology

## 2022-06-25 NOTE — Telephone Encounter (Signed)
Liliana Cline: [phone] exp. 06/26/22-07/26/22 for [facility] no vm set up sent mychart message

## 2022-06-27 ENCOUNTER — Other Ambulatory Visit (HOSPITAL_COMMUNITY): Payer: Self-pay

## 2022-06-29 ENCOUNTER — Other Ambulatory Visit (HOSPITAL_COMMUNITY): Payer: Self-pay

## 2022-06-29 ENCOUNTER — Ambulatory Visit (INDEPENDENT_AMBULATORY_CARE_PROVIDER_SITE_OTHER): Payer: No Typology Code available for payment source | Admitting: Physical Therapy

## 2022-06-29 ENCOUNTER — Encounter: Payer: Self-pay | Admitting: Physical Therapy

## 2022-06-29 DIAGNOSIS — M25571 Pain in right ankle and joints of right foot: Secondary | ICD-10-CM

## 2022-06-29 MED ORDER — PROPRANOLOL HCL 20 MG PO TABS
20.0000 mg | ORAL_TABLET | Freq: Two times a day (BID) | ORAL | 1 refills | Status: AC
  Filled 2022-06-29: qty 60, 30d supply, fill #0

## 2022-06-29 MED ORDER — HYDROXYZINE HCL 10 MG PO TABS
10.0000 mg | ORAL_TABLET | Freq: Every evening | ORAL | 1 refills | Status: AC | PRN
  Filled 2022-06-29: qty 60, 30d supply, fill #0

## 2022-06-29 MED ORDER — CITALOPRAM HYDROBROMIDE 20 MG PO TABS
20.0000 mg | ORAL_TABLET | Freq: Every day | ORAL | 1 refills | Status: AC
  Filled 2022-06-29: qty 30, 30d supply, fill #0

## 2022-06-29 MED ORDER — AMPHETAMINE-DEXTROAMPHETAMINE 15 MG PO TABS
22.5000 mg | ORAL_TABLET | Freq: Two times a day (BID) | ORAL | 0 refills | Status: DC
  Filled 2022-07-29 – 2022-07-30 (×2): qty 90, 30d supply, fill #0

## 2022-06-29 MED ORDER — AMPHETAMINE-DEXTROAMPHETAMINE 15 MG PO TABS
1.5000 | ORAL_TABLET | Freq: Two times a day (BID) | ORAL | 0 refills | Status: AC
  Filled 2022-07-02: qty 90, 30d supply, fill #0
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
## 2022-06-30 ENCOUNTER — Other Ambulatory Visit (HOSPITAL_COMMUNITY): Payer: Self-pay

## 2022-07-01 ENCOUNTER — Ambulatory Visit (INDEPENDENT_AMBULATORY_CARE_PROVIDER_SITE_OTHER): Payer: No Typology Code available for payment source | Admitting: Physical Therapy

## 2022-07-01 ENCOUNTER — Other Ambulatory Visit (HOSPITAL_COMMUNITY): Payer: Self-pay

## 2022-07-01 ENCOUNTER — Encounter: Payer: Self-pay | Admitting: Physical Therapy

## 2022-07-01 DIAGNOSIS — M25571 Pain in right ankle and joints of right foot: Secondary | ICD-10-CM
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
## 2022-07-02 ENCOUNTER — Other Ambulatory Visit (HOSPITAL_COMMUNITY): Payer: Self-pay
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
## 2022-07-06 ENCOUNTER — Other Ambulatory Visit (HOSPITAL_COMMUNITY): Payer: Self-pay

## 2022-07-06 ENCOUNTER — Ambulatory Visit (INDEPENDENT_AMBULATORY_CARE_PROVIDER_SITE_OTHER): Payer: No Typology Code available for payment source | Admitting: Family Medicine

## 2022-07-06 VITALS — BP 122/82 | HR 117 | Ht 72.0 in | Wt 203.0 lb

## 2022-07-06 DIAGNOSIS — M5416 Radiculopathy, lumbar region: Secondary | ICD-10-CM

## 2022-07-06 DIAGNOSIS — G8929 Other chronic pain: Secondary | ICD-10-CM | POA: Diagnosis not present

## 2022-07-06 DIAGNOSIS — M25571 Pain in right ankle and joints of right foot: Secondary | ICD-10-CM | POA: Diagnosis not present

## 2022-07-06 MED ORDER — GABAPENTIN 300 MG PO CAPS
300.0000 mg | ORAL_CAPSULE | Freq: Three times a day (TID) | ORAL | 3 refills | Status: AC | PRN
  Filled 2022-07-06: qty 90, 30d supply, fill #0
  Filled 2023-06-04 – 2023-06-07 (×2): qty 90, 30d supply, fill #1

## 2022-07-06 MED ORDER — PREDNISONE 50 MG PO TABS
50.0000 mg | ORAL_TABLET | Freq: Every day | ORAL | 0 refills | Status: DC
  Filled 2022-07-06: qty 5, 5d supply, fill #0

## 2022-07-06 NOTE — Patient Instructions (Signed)
Thank you for coming in today.   Add on lumbar radiculopathy to PT.   Use prednisone if needed.  Use gabapentin for nerve pain if needed especially at night.   If not getting better next step for the nerve pain would be MRI lumbar spine.  I can order that with a Mychart message.   For your daughter EMDR peds PTSD search terms. Her existing mental heath relationships would be a good starting point.

## 2022-07-08 ENCOUNTER — Ambulatory Visit (INDEPENDENT_AMBULATORY_CARE_PROVIDER_SITE_OTHER): Payer: No Typology Code available for payment source | Admitting: Physical Therapy

## 2022-07-08 ENCOUNTER — Encounter: Payer: Self-pay | Admitting: Physical Therapy

## 2022-07-08 DIAGNOSIS — M5459 Other low back pain: Secondary | ICD-10-CM | POA: Diagnosis not present

## 2022-07-08 DIAGNOSIS — M25571 Pain in right ankle and joints of right foot: Secondary | ICD-10-CM | POA: Diagnosis not present
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
## 2022-07-16 ENCOUNTER — Ambulatory Visit (INDEPENDENT_AMBULATORY_CARE_PROVIDER_SITE_OTHER): Payer: No Typology Code available for payment source | Admitting: Physical Therapy

## 2022-07-16 ENCOUNTER — Other Ambulatory Visit (HOSPITAL_COMMUNITY): Payer: Self-pay

## 2022-07-16 ENCOUNTER — Encounter: Payer: Self-pay | Admitting: Physical Therapy

## 2022-07-16 DIAGNOSIS — M5459 Other low back pain: Secondary | ICD-10-CM

## 2022-07-16 DIAGNOSIS — M25571 Pain in right ankle and joints of right foot: Secondary | ICD-10-CM
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
## 2022-07-20 ENCOUNTER — Ambulatory Visit (INDEPENDENT_AMBULATORY_CARE_PROVIDER_SITE_OTHER): Payer: No Typology Code available for payment source | Admitting: Physical Therapy

## 2022-07-20 DIAGNOSIS — M5459 Other low back pain: Secondary | ICD-10-CM

## 2022-07-20 DIAGNOSIS — M25571 Pain in right ankle and joints of right foot: Secondary | ICD-10-CM
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
## 2022-07-21 ENCOUNTER — Encounter: Payer: Self-pay | Admitting: Physical Therapy

## 2022-07-23 ENCOUNTER — Ambulatory Visit (INDEPENDENT_AMBULATORY_CARE_PROVIDER_SITE_OTHER): Payer: No Typology Code available for payment source | Admitting: Physical Therapy

## 2022-07-23 ENCOUNTER — Encounter: Payer: Self-pay | Admitting: Physical Therapy

## 2022-07-23 DIAGNOSIS — M25571 Pain in right ankle and joints of right foot: Secondary | ICD-10-CM

## 2022-07-23 DIAGNOSIS — M5459 Other low back pain: Secondary | ICD-10-CM

## 2022-07-24 ENCOUNTER — Encounter: Payer: No Typology Code available for payment source | Admitting: Physical Therapy

## 2022-07-24 NOTE — Therapy (Signed)
Pt not seen this date, 07/23/22.  Note created in error.   Lyndee Hensen, PT, DPT 12:58 PM  07/24/22

## 2022-07-28 ENCOUNTER — Other Ambulatory Visit (HOSPITAL_COMMUNITY): Payer: Self-pay

## 2022-07-29 ENCOUNTER — Encounter: Payer: No Typology Code available for payment source | Admitting: Physical Therapy

## 2022-07-29 ENCOUNTER — Other Ambulatory Visit (HOSPITAL_COMMUNITY): Payer: Self-pay

## 2022-07-30 ENCOUNTER — Other Ambulatory Visit (HOSPITAL_COMMUNITY): Payer: Self-pay

## 2022-08-28 ENCOUNTER — Other Ambulatory Visit (HOSPITAL_COMMUNITY): Payer: Self-pay

## 2022-09-02 ENCOUNTER — Other Ambulatory Visit (HOSPITAL_COMMUNITY): Payer: Self-pay

## 2022-09-02 MED ORDER — HYDROXYZINE HCL 10 MG PO TABS
10.0000 mg | ORAL_TABLET | Freq: Every evening | ORAL | 1 refills | Status: AC | PRN
  Filled 2022-09-02: qty 60, 30d supply, fill #0

## 2022-09-02 MED ORDER — AMPHETAMINE-DEXTROAMPHETAMINE 15 MG PO TABS
22.5000 mg | ORAL_TABLET | Freq: Two times a day (BID) | ORAL | 0 refills | Status: DC
  Filled 2022-10-14: qty 90, 30d supply, fill #0

## 2022-09-02 MED ORDER — PROPRANOLOL HCL 20 MG PO TABS
20.0000 mg | ORAL_TABLET | Freq: Two times a day (BID) | ORAL | 1 refills | Status: AC
  Filled 2022-09-02: qty 60, 30d supply, fill #0
  Filled 2023-07-15: qty 60, 30d supply, fill #1

## 2022-09-02 MED ORDER — AMPHETAMINE-DEXTROAMPHETAMINE 15 MG PO TABS
22.5000 mg | ORAL_TABLET | Freq: Two times a day (BID) | ORAL | 0 refills | Status: AC
  Filled 2022-09-02: qty 90, 30d supply, fill #0

## 2022-09-02 MED ORDER — CITALOPRAM HYDROBROMIDE 20 MG PO TABS
20.0000 mg | ORAL_TABLET | Freq: Every day | ORAL | 1 refills | Status: AC
  Filled 2022-09-02: qty 30, 30d supply, fill #0

## 2022-10-14 ENCOUNTER — Other Ambulatory Visit (HOSPITAL_COMMUNITY): Payer: Self-pay

## 2022-10-21 ENCOUNTER — Other Ambulatory Visit (HOSPITAL_COMMUNITY): Payer: Self-pay

## 2022-10-21 ENCOUNTER — Other Ambulatory Visit: Payer: Self-pay

## 2022-10-21 ENCOUNTER — Ambulatory Visit (INDEPENDENT_AMBULATORY_CARE_PROVIDER_SITE_OTHER): Payer: 59 | Admitting: Family Medicine

## 2022-10-21 VITALS — BP 128/82 | HR 124 | Ht 72.0 in | Wt 206.0 lb

## 2022-10-21 DIAGNOSIS — M5416 Radiculopathy, lumbar region: Secondary | ICD-10-CM

## 2022-10-21 DIAGNOSIS — R29898 Other symptoms and signs involving the musculoskeletal system: Secondary | ICD-10-CM | POA: Diagnosis not present

## 2022-10-21 MED ORDER — PREDNISONE 50 MG PO TABS
50.0000 mg | ORAL_TABLET | Freq: Every day | ORAL | 0 refills | Status: AC
  Filled 2022-10-21: qty 5, 5d supply, fill #0

## 2022-10-21 NOTE — Patient Instructions (Addendum)
Thank you for coming in today.   You should hear from MRI scheduling within 1 week. If you do not hear please let me know.    Continue gabapentin as needed  Anticipate epidural steroid injection after the MRI.   We may consider a PT tune up as well based on how you do with the shot.
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
## 2022-10-27 ENCOUNTER — Other Ambulatory Visit: Payer: Self-pay

## 2022-10-27 ENCOUNTER — Other Ambulatory Visit (HOSPITAL_COMMUNITY): Payer: Self-pay

## 2022-10-27 MED ORDER — AMPHETAMINE-DEXTROAMPHETAMINE 15 MG PO TABS
22.5000 mg | ORAL_TABLET | Freq: Two times a day (BID) | ORAL | 0 refills | Status: AC
  Filled 2022-11-14: qty 90, 30d supply, fill #0

## 2022-10-27 MED ORDER — PROPRANOLOL HCL 20 MG PO TABS
20.0000 mg | ORAL_TABLET | Freq: Two times a day (BID) | ORAL | 1 refills | Status: AC
  Filled 2022-10-27: qty 60, 30d supply, fill #0
  Filled 2023-10-25: qty 60, 30d supply, fill #1

## 2022-10-27 MED ORDER — CITALOPRAM HYDROBROMIDE 20 MG PO TABS
20.0000 mg | ORAL_TABLET | Freq: Every day | ORAL | 1 refills | Status: AC
  Filled 2022-10-27: qty 30, 30d supply, fill #0
  Filled 2023-07-15: qty 30, 30d supply, fill #1

## 2022-10-27 MED ORDER — HYDROXYZINE HCL 10 MG PO TABS
10.0000 mg | ORAL_TABLET | Freq: Every evening | ORAL | 1 refills | Status: AC | PRN
  Filled 2022-10-27: qty 60, 30d supply, fill #0

## 2022-10-27 MED ORDER — AMPHETAMINE-DEXTROAMPHETAMINE 15 MG PO TABS
22.5000 mg | ORAL_TABLET | Freq: Two times a day (BID) | ORAL | 0 refills | Status: AC
  Filled 2023-02-04: qty 90, 30d supply, fill #0

## 2022-10-28 ENCOUNTER — Other Ambulatory Visit (HOSPITAL_COMMUNITY): Payer: Self-pay

## 2022-10-28 ENCOUNTER — Other Ambulatory Visit: Payer: Self-pay

## 2022-11-14 ENCOUNTER — Other Ambulatory Visit (HOSPITAL_COMMUNITY): Payer: Self-pay

## 2022-11-21 ENCOUNTER — Ambulatory Visit (INDEPENDENT_AMBULATORY_CARE_PROVIDER_SITE_OTHER): Payer: 59

## 2022-11-21 DIAGNOSIS — M5416 Radiculopathy, lumbar region: Secondary | ICD-10-CM | POA: Diagnosis not present

## 2022-11-21 DIAGNOSIS — M4316 Spondylolisthesis, lumbar region: Secondary | ICD-10-CM | POA: Diagnosis not present

## 2022-11-21 DIAGNOSIS — M5126 Other intervertebral disc displacement, lumbar region: Secondary | ICD-10-CM | POA: Diagnosis not present

## 2022-11-23 ENCOUNTER — Telehealth: Payer: Self-pay | Admitting: Family Medicine

## 2022-11-23 DIAGNOSIS — R29898 Other symptoms and signs involving the musculoskeletal system: Secondary | ICD-10-CM

## 2022-11-23 DIAGNOSIS — M5416 Radiculopathy, lumbar region: Secondary | ICD-10-CM

## 2022-11-23 NOTE — Progress Notes (Signed)
Lumbar spine MRI shows multiple nerves that could be pinched in several areas that could cause back pain. I have ordered an epidural steroid injection that should help with the pain going down your leg and the weakness.  This should also help with back pain some.  Recommend proceeding with a back injection and let me know how it goes.  Recommend return to clinic following the back injection so we can talk about the MRI in full detail and talk about what we will do next if that does not work. Phone number to the back injection scheduling if they do not call you is [phone]

## 2022-11-23 NOTE — Telephone Encounter (Signed)
Epidural steroid injection ordered 

## 2022-12-19 ENCOUNTER — Other Ambulatory Visit: Payer: Self-pay

## 2022-12-23 ENCOUNTER — Other Ambulatory Visit (HOSPITAL_COMMUNITY): Payer: Self-pay

## 2022-12-23 DIAGNOSIS — F419 Anxiety disorder, unspecified: Secondary | ICD-10-CM | POA: Diagnosis not present

## 2022-12-23 DIAGNOSIS — F900 Attention-deficit hyperactivity disorder, predominantly inattentive type: Secondary | ICD-10-CM | POA: Diagnosis not present

## 2022-12-23 MED ORDER — HYDROXYZINE HCL 25 MG PO TABS
25.0000 mg | ORAL_TABLET | Freq: Every evening | ORAL | 1 refills | Status: AC
  Filled 2022-12-26: qty 30, 30d supply, fill #0

## 2022-12-23 MED ORDER — CITALOPRAM HYDROBROMIDE 20 MG PO TABS
20.0000 mg | ORAL_TABLET | Freq: Every day | ORAL | 1 refills | Status: AC
  Filled 2022-12-23: qty 30, 30d supply, fill #0
  Filled 2023-10-25: qty 30, 30d supply, fill #1

## 2022-12-23 MED ORDER — AMPHETAMINE-DEXTROAMPHETAMINE 15 MG PO TABS
22.5000 mg | ORAL_TABLET | Freq: Two times a day (BID) | ORAL | 0 refills | Status: AC
  Filled 2023-05-14: qty 90, 30d supply, fill #0

## 2022-12-23 MED ORDER — AMPHETAMINE-DEXTROAMPHETAMINE 15 MG PO TABS
22.5000 mg | ORAL_TABLET | Freq: Two times a day (BID) | ORAL | 0 refills | Status: AC
  Filled 2022-12-25: qty 90, 30d supply, fill #0

## 2022-12-24 ENCOUNTER — Other Ambulatory Visit: Payer: Self-pay

## 2022-12-24 ENCOUNTER — Encounter (HOSPITAL_COMMUNITY): Payer: Self-pay

## 2022-12-24 ENCOUNTER — Other Ambulatory Visit (HOSPITAL_COMMUNITY): Payer: Self-pay

## 2022-12-25 ENCOUNTER — Other Ambulatory Visit (HOSPITAL_COMMUNITY): Payer: Self-pay

## 2022-12-25 ENCOUNTER — Other Ambulatory Visit: Payer: Self-pay

## 2022-12-25 ENCOUNTER — Ambulatory Visit
Admission: RE | Admit: 2022-12-25 | Discharge: 2022-12-25 | Disposition: A | Discharge status: Home / Self Care | Payer: 59 | Source: Ambulatory Visit | Attending: Family Medicine | Admitting: Family Medicine

## 2022-12-25 ENCOUNTER — Encounter (HOSPITAL_COMMUNITY): Payer: Self-pay

## 2022-12-25 DIAGNOSIS — R29898 Other symptoms and signs involving the musculoskeletal system: Secondary | ICD-10-CM

## 2022-12-25 DIAGNOSIS — M5416 Radiculopathy, lumbar region: Secondary | ICD-10-CM

## 2022-12-25 DIAGNOSIS — M545 Low back pain, unspecified: Secondary | ICD-10-CM | POA: Diagnosis not present

## 2022-12-25 MED ORDER — METHYLPREDNISOLONE ACETATE 40 MG/ML INJ SUSP (RADIOLOG
80.0000 mg | Freq: Once | INTRAMUSCULAR | Status: AC
  Administered 2022-12-25: 80 mg via EPIDURAL

## 2022-12-25 MED ORDER — IOPAMIDOL (ISOVUE-M 200) INJECTION 41%
1.0000 mL | Freq: Once | INTRAMUSCULAR | Status: AC
  Administered 2022-12-25: 1 mL via EPIDURAL

## 2022-12-25 NOTE — Discharge Instructions (Signed)

## 2022-12-26 ENCOUNTER — Other Ambulatory Visit (HOSPITAL_COMMUNITY): Payer: Self-pay

## 2022-12-28 ENCOUNTER — Other Ambulatory Visit (HOSPITAL_COMMUNITY): Payer: Self-pay

## 2022-12-28 ENCOUNTER — Encounter (HOSPITAL_COMMUNITY): Payer: Self-pay

## 2022-12-31 ENCOUNTER — Other Ambulatory Visit (HOSPITAL_COMMUNITY): Payer: Self-pay

## 2023-01-01 ENCOUNTER — Other Ambulatory Visit: Payer: Self-pay

## 2023-02-04 ENCOUNTER — Other Ambulatory Visit (HOSPITAL_COMMUNITY): Payer: Self-pay

## 2023-02-25 ENCOUNTER — Other Ambulatory Visit: Payer: Self-pay

## 2023-02-25 DIAGNOSIS — F900 Attention-deficit hyperactivity disorder, predominantly inattentive type: Secondary | ICD-10-CM | POA: Diagnosis not present

## 2023-02-25 DIAGNOSIS — F419 Anxiety disorder, unspecified: Secondary | ICD-10-CM | POA: Diagnosis not present

## 2023-02-25 MED ORDER — PROPRANOLOL HCL 20 MG PO TABS
20.0000 mg | ORAL_TABLET | Freq: Two times a day (BID) | ORAL | 1 refills | Status: AC
  Filled 2023-02-25: qty 60, 30d supply, fill #0
  Filled 2024-02-02: qty 60, 30d supply, fill #1

## 2023-02-25 MED ORDER — AMPHETAMINE-DEXTROAMPHETAMINE 15 MG PO TABS
22.5000 mg | ORAL_TABLET | Freq: Two times a day (BID) | ORAL | 0 refills | Status: AC
  Filled 2023-04-07: qty 90, 30d supply, fill #0

## 2023-02-25 MED ORDER — HYDROXYZINE HCL 25 MG PO TABS
ORAL_TABLET | ORAL | 1 refills | Status: AC
  Filled 2023-02-25: qty 30, 30d supply, fill #0
  Filled 2023-10-25: qty 30, 30d supply, fill #1

## 2023-02-25 MED ORDER — AMPHETAMINE-DEXTROAMPHETAMINE 15 MG PO TABS
22.5000 mg | ORAL_TABLET | Freq: Two times a day (BID) | ORAL | 0 refills | Status: AC
  Filled 2023-02-25: qty 90, 90d supply, fill #0
  Filled 2023-03-05: qty 90, 30d supply, fill #0

## 2023-02-25 MED ORDER — CITALOPRAM HYDROBROMIDE 20 MG PO TABS
20.0000 mg | ORAL_TABLET | Freq: Every day | ORAL | 1 refills | Status: AC
  Filled 2023-02-25: qty 30, 30d supply, fill #0
  Filled 2024-02-02: qty 30, 30d supply, fill #1

## 2023-02-26 ENCOUNTER — Other Ambulatory Visit (HOSPITAL_COMMUNITY): Payer: Self-pay

## 2023-02-26 ENCOUNTER — Other Ambulatory Visit: Payer: Self-pay
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
## 2023-03-05 ENCOUNTER — Other Ambulatory Visit (HOSPITAL_COMMUNITY): Payer: Self-pay

## 2023-03-05 ENCOUNTER — Other Ambulatory Visit: Payer: Self-pay

## 2023-03-05 ENCOUNTER — Ambulatory Visit: Payer: 59 | Admitting: Family Medicine

## 2023-03-05 ENCOUNTER — Encounter: Payer: Self-pay | Admitting: Family Medicine

## 2023-03-05 VITALS — BP 110/70 | HR 111 | Ht 72.0 in | Wt 194.2 lb

## 2023-03-05 DIAGNOSIS — M5416 Radiculopathy, lumbar region: Secondary | ICD-10-CM

## 2023-03-05 NOTE — Patient Instructions (Addendum)
Thank you for coming in today.   Please call [facility] at [phone] to schedule your spine injection.    Let me know if this is not working.   I an get you a surgical consult whenever you ask.

## 2023-04-07 ENCOUNTER — Other Ambulatory Visit (HOSPITAL_COMMUNITY): Payer: Self-pay

## 2023-04-07 ENCOUNTER — Other Ambulatory Visit: Payer: Self-pay

## 2023-04-09 ENCOUNTER — Other Ambulatory Visit: Payer: Self-pay | Admitting: Family Medicine

## 2023-04-09 ENCOUNTER — Ambulatory Visit
Admission: RE | Admit: 2023-04-09 | Discharge: 2023-04-09 | Disposition: A | Discharge status: Home / Self Care | Payer: BC Managed Care – PPO | Source: Ambulatory Visit | Attending: Family Medicine | Admitting: Family Medicine

## 2023-04-09 DIAGNOSIS — M5416 Radiculopathy, lumbar region: Secondary | ICD-10-CM

## 2023-04-09 MED ORDER — METHYLPREDNISOLONE ACETATE 40 MG/ML INJ SUSP (RADIOLOG
80.0000 mg | Freq: Once | INTRAMUSCULAR | Status: AC
  Administered 2023-04-09: 80 mg via EPIDURAL

## 2023-04-09 MED ORDER — IOPAMIDOL (ISOVUE-M 200) INJECTION 41%
1.0000 mL | Freq: Once | INTRAMUSCULAR | Status: AC
  Administered 2023-04-09: 1 mL via EPIDURAL

## 2023-04-09 NOTE — Discharge Instructions (Signed)

## 2023-04-22 ENCOUNTER — Other Ambulatory Visit: Payer: Self-pay

## 2023-04-22 ENCOUNTER — Other Ambulatory Visit (HOSPITAL_COMMUNITY): Payer: Self-pay

## 2023-04-22 MED ORDER — PROPRANOLOL HCL 20 MG PO TABS
20.0000 mg | ORAL_TABLET | Freq: Two times a day (BID) | ORAL | 1 refills | Status: AC
  Filled 2023-04-22: qty 60, 30d supply, fill #0

## 2023-04-22 MED ORDER — AMPHETAMINE-DEXTROAMPHETAMINE 15 MG PO TABS: 22.5000 mg | ORAL_TABLET | Freq: Two times a day (BID) | ORAL | 0 refills | Status: AC

## 2023-04-22 MED ORDER — CITALOPRAM HYDROBROMIDE 20 MG PO TABS
20.0000 mg | ORAL_TABLET | Freq: Every day | ORAL | 1 refills | Status: AC
  Filled 2023-04-22: qty 30, 30d supply, fill #0

## 2023-04-22 MED ORDER — HYDROXYZINE HCL 25 MG PO TABS
25.0000 mg | ORAL_TABLET | Freq: Every day | ORAL | 1 refills | Status: AC
  Filled 2023-04-22: qty 30, 30d supply, fill #0
  Filled 2023-07-15: qty 30, 30d supply, fill #1

## 2023-04-22 MED ORDER — AMPHETAMINE-DEXTROAMPHETAMINE 15 MG PO TABS
22.5000 mg | ORAL_TABLET | Freq: Two times a day (BID) | ORAL | 0 refills | Status: AC
  Filled 2023-06-11: qty 90, 30d supply, fill #0

## 2023-05-14 ENCOUNTER — Other Ambulatory Visit (HOSPITAL_COMMUNITY): Payer: Self-pay

## 2023-05-14 ENCOUNTER — Other Ambulatory Visit: Payer: Self-pay

## 2023-05-14 ENCOUNTER — Encounter: Payer: Self-pay | Admitting: Family Medicine

## 2023-05-14 DIAGNOSIS — M5416 Radiculopathy, lumbar region: Secondary | ICD-10-CM

## 2023-06-05 ENCOUNTER — Other Ambulatory Visit (HOSPITAL_COMMUNITY): Payer: Self-pay

## 2023-06-07 ENCOUNTER — Other Ambulatory Visit (HOSPITAL_COMMUNITY): Payer: Self-pay

## 2023-06-11 ENCOUNTER — Other Ambulatory Visit (HOSPITAL_COMMUNITY): Payer: Self-pay

## 2023-06-11 ENCOUNTER — Other Ambulatory Visit: Payer: Self-pay

## 2023-06-12 ENCOUNTER — Other Ambulatory Visit (HOSPITAL_COMMUNITY): Payer: Self-pay

## 2023-06-17 ENCOUNTER — Other Ambulatory Visit (HOSPITAL_COMMUNITY): Payer: Self-pay

## 2023-06-17 MED ORDER — AMPHETAMINE-DEXTROAMPHETAMINE 15 MG PO TABS
22.5000 mg | ORAL_TABLET | Freq: Two times a day (BID) | ORAL | 0 refills | Status: AC
  Filled 2023-08-21: qty 90, 30d supply, fill #0

## 2023-06-17 MED ORDER — AMPHETAMINE-DEXTROAMPHETAMINE 15 MG PO TABS
22.5000 mg | ORAL_TABLET | Freq: Two times a day (BID) | ORAL | 0 refills | Status: AC
  Filled 2023-07-15: qty 90, 30d supply, fill #0

## 2023-06-17 MED ORDER — HYDROXYZINE HCL 25 MG PO TABS
25.0000 mg | ORAL_TABLET | Freq: Every evening | ORAL | 2 refills | Status: AC | PRN
  Filled 2023-06-17: qty 30, 30d supply, fill #0
  Filled 2024-03-20: qty 30, 30d supply, fill #1

## 2023-06-17 MED ORDER — PROPRANOLOL HCL 20 MG PO TABS
20.0000 mg | ORAL_TABLET | Freq: Two times a day (BID) | ORAL | 2 refills | Status: AC
  Filled 2023-06-17: qty 60, 30d supply, fill #0

## 2023-06-17 MED ORDER — CITALOPRAM HYDROBROMIDE 20 MG PO TABS
20.0000 mg | ORAL_TABLET | Freq: Every day | ORAL | 2 refills | Status: AC
  Filled 2023-06-17: qty 30, 30d supply, fill #0

## 2023-06-17 MED ORDER — AMPHETAMINE-DEXTROAMPHETAMINE 15 MG PO TABS
22.5000 mg | ORAL_TABLET | Freq: Two times a day (BID) | ORAL | 0 refills | Status: DC
  Filled 2023-10-25: qty 90, 30d supply, fill #0

## 2023-06-24 ENCOUNTER — Other Ambulatory Visit (HOSPITAL_COMMUNITY): Payer: Self-pay

## 2023-06-24 MED ORDER — TIZANIDINE HCL 2 MG PO TABS
2.0000 mg | ORAL_TABLET | Freq: Four times a day (QID) | ORAL | 0 refills | Status: AC | PRN
  Filled 2023-06-24: qty 45, 12d supply, fill #0

## 2023-06-24 MED ORDER — METHYLPREDNISOLONE 4 MG PO TBPK
ORAL_TABLET | ORAL | 0 refills | Status: DC
  Filled 2023-06-24: qty 21, 6d supply, fill #0

## 2023-06-24 MED ORDER — TRAMADOL HCL 50 MG PO TABS
50.0000 mg | ORAL_TABLET | Freq: Four times a day (QID) | ORAL | 0 refills | Status: DC | PRN
  Filled 2023-06-24: qty 45, 12d supply, fill #0

## 2023-07-15 ENCOUNTER — Other Ambulatory Visit: Payer: Self-pay

## 2023-07-15 ENCOUNTER — Other Ambulatory Visit (HOSPITAL_COMMUNITY): Payer: Self-pay

## 2023-08-21 ENCOUNTER — Other Ambulatory Visit (HOSPITAL_COMMUNITY): Payer: Self-pay

## 2023-08-23 ENCOUNTER — Other Ambulatory Visit: Payer: Self-pay

## 2023-08-24 ENCOUNTER — Other Ambulatory Visit: Payer: Self-pay

## 2023-08-24 ENCOUNTER — Other Ambulatory Visit (HOSPITAL_COMMUNITY): Payer: Self-pay

## 2023-08-24 MED ORDER — TRAMADOL HCL 50 MG PO TABS
50.0000 mg | ORAL_TABLET | Freq: Four times a day (QID) | ORAL | 0 refills | Status: DC | PRN
  Filled 2023-08-24: qty 45, 12d supply, fill #0

## 2023-09-24 ENCOUNTER — Other Ambulatory Visit (HOSPITAL_COMMUNITY): Payer: Self-pay

## 2023-10-25 ENCOUNTER — Other Ambulatory Visit (HOSPITAL_COMMUNITY): Payer: Self-pay

## 2023-10-25 ENCOUNTER — Other Ambulatory Visit: Payer: Self-pay

## 2023-10-26 ENCOUNTER — Other Ambulatory Visit (HOSPITAL_COMMUNITY): Payer: Self-pay

## 2023-10-26 MED ORDER — TRAMADOL HCL 50 MG PO TABS
50.0000 mg | ORAL_TABLET | Freq: Four times a day (QID) | ORAL | 0 refills | Status: DC | PRN
  Filled 2023-10-26: qty 20, 5d supply, fill #0

## 2023-10-27 ENCOUNTER — Other Ambulatory Visit: Payer: Self-pay

## 2023-10-29 ENCOUNTER — Other Ambulatory Visit: Payer: Self-pay

## 2023-10-29 ENCOUNTER — Other Ambulatory Visit (HOSPITAL_COMMUNITY): Payer: Self-pay

## 2023-10-29 MED ORDER — CITALOPRAM HYDROBROMIDE 20 MG PO TABS
20.0000 mg | ORAL_TABLET | Freq: Every day | ORAL | 2 refills | Status: AC
  Filled 2024-07-05: qty 30, 30d supply, fill #0

## 2023-10-29 MED ORDER — PROPRANOLOL HCL 20 MG PO TABS
20.0000 mg | ORAL_TABLET | Freq: Two times a day (BID) | ORAL | 2 refills | Status: AC
  Filled 2024-07-05: qty 60, 30d supply, fill #0

## 2023-10-29 MED ORDER — AMPHETAMINE-DEXTROAMPHETAMINE 15 MG PO TABS
22.5000 mg | ORAL_TABLET | Freq: Two times a day (BID) | ORAL | 0 refills | Status: AC
  Filled 2024-01-07: qty 90, 30d supply, fill #0

## 2023-10-29 MED ORDER — AMPHETAMINE-DEXTROAMPHETAMINE 15 MG PO TABS
22.5000 mg | ORAL_TABLET | Freq: Two times a day (BID) | ORAL | 0 refills | Status: DC
  Filled 2023-11-20 – 2024-02-04 (×3): qty 90, 30d supply, fill #0

## 2023-10-29 MED ORDER — AMPHETAMINE-DEXTROAMPHETAMINE 15 MG PO TABS
22.5000 mg | ORAL_TABLET | Freq: Two times a day (BID) | ORAL | 0 refills | Status: AC
  Filled 2023-12-03: qty 90, 30d supply, fill #0

## 2023-10-29 MED ORDER — HYDROXYZINE HCL 25 MG PO TABS
25.0000 mg | ORAL_TABLET | Freq: Every evening | ORAL | 2 refills | Status: AC | PRN
  Filled 2023-11-20: qty 30, 30d supply, fill #0
  Filled 2024-05-17: qty 30, 30d supply, fill #1

## 2023-11-21 ENCOUNTER — Other Ambulatory Visit: Payer: Self-pay

## 2023-11-22 ENCOUNTER — Other Ambulatory Visit: Payer: Self-pay

## 2023-12-03 ENCOUNTER — Other Ambulatory Visit: Payer: Self-pay

## 2023-12-09 ENCOUNTER — Other Ambulatory Visit (HOSPITAL_COMMUNITY): Payer: Self-pay

## 2023-12-09 ENCOUNTER — Other Ambulatory Visit: Payer: Self-pay

## 2023-12-09 ENCOUNTER — Other Ambulatory Visit (HOSPITAL_BASED_OUTPATIENT_CLINIC_OR_DEPARTMENT_OTHER): Payer: Self-pay

## 2023-12-09 MED ORDER — TIZANIDINE HCL 2 MG PO TABS
2.0000 mg | ORAL_TABLET | Freq: Four times a day (QID) | ORAL | 1 refills | Status: AC | PRN
  Filled 2023-12-09: qty 45, 15d supply, fill #0
  Filled 2024-02-02: qty 45, 15d supply, fill #1

## 2024-01-07 ENCOUNTER — Other Ambulatory Visit (HOSPITAL_COMMUNITY): Payer: Self-pay

## 2024-01-07 ENCOUNTER — Other Ambulatory Visit: Payer: Self-pay

## 2024-02-02 ENCOUNTER — Other Ambulatory Visit (HOSPITAL_COMMUNITY): Payer: Self-pay

## 2024-02-03 ENCOUNTER — Other Ambulatory Visit (HOSPITAL_COMMUNITY): Payer: Self-pay

## 2024-02-04 ENCOUNTER — Other Ambulatory Visit: Payer: Self-pay

## 2024-02-04 ENCOUNTER — Other Ambulatory Visit (HOSPITAL_COMMUNITY): Payer: Self-pay

## 2024-02-07 ENCOUNTER — Other Ambulatory Visit (HOSPITAL_COMMUNITY): Payer: Self-pay

## 2024-02-07 MED ORDER — TIZANIDINE HCL 2 MG PO TABS
2.0000 mg | ORAL_TABLET | ORAL | 1 refills | Status: AC
  Filled 2024-02-07: qty 45, 11d supply, fill #0
  Filled 2024-05-17: qty 45, 15d supply, fill #0
  Filled 2024-07-05: qty 45, 15d supply, fill #1

## 2024-02-07 MED ORDER — OXYCODONE-ACETAMINOPHEN 5-325 MG PO TABS
1.0000 | ORAL_TABLET | ORAL | 0 refills | Status: DC | PRN
  Filled 2024-02-07: qty 40, 7d supply, fill #0

## 2024-02-14 ENCOUNTER — Other Ambulatory Visit: Payer: Self-pay

## 2024-02-23 ENCOUNTER — Other Ambulatory Visit: Payer: Self-pay

## 2024-02-23 ENCOUNTER — Other Ambulatory Visit (HOSPITAL_COMMUNITY): Payer: Self-pay

## 2024-02-23 MED ORDER — TIZANIDINE HCL 2 MG PO TABS
2.0000 mg | ORAL_TABLET | Freq: Four times a day (QID) | ORAL | 1 refills | Status: AC | PRN
  Filled 2024-02-23: qty 45, 12d supply, fill #0
  Filled 2024-09-27: qty 45, 12d supply, fill #1

## 2024-03-20 ENCOUNTER — Other Ambulatory Visit (HOSPITAL_COMMUNITY): Payer: Self-pay

## 2024-03-21 ENCOUNTER — Other Ambulatory Visit: Payer: Self-pay

## 2024-03-21 ENCOUNTER — Encounter: Payer: Self-pay | Admitting: Pharmacist

## 2024-03-24 ENCOUNTER — Other Ambulatory Visit: Payer: Self-pay

## 2024-03-28 ENCOUNTER — Other Ambulatory Visit: Payer: Self-pay

## 2024-03-28 ENCOUNTER — Other Ambulatory Visit (HOSPITAL_COMMUNITY): Payer: Self-pay

## 2024-03-28 MED ORDER — HYDROXYZINE HCL 25 MG PO TABS
25.0000 mg | ORAL_TABLET | Freq: Every evening | ORAL | 2 refills | Status: AC | PRN
  Filled 2024-03-28: qty 30, 30d supply, fill #0
  Filled 2024-11-02: qty 30, 30d supply, fill #1

## 2024-03-28 MED ORDER — AMPHETAMINE-DEXTROAMPHETAMINE 15 MG PO TABS
22.5000 mg | ORAL_TABLET | Freq: Two times a day (BID) | ORAL | 0 refills | Status: AC
  Filled 2024-03-28: qty 90, 30d supply, fill #0

## 2024-03-28 MED ORDER — PROPRANOLOL HCL 20 MG PO TABS
20.0000 mg | ORAL_TABLET | Freq: Two times a day (BID) | ORAL | 2 refills | Status: AC
  Filled 2024-03-28: qty 60, 30d supply, fill #0
  Filled 2024-09-27: qty 60, 30d supply, fill #1
  Filled 2024-11-02: qty 60, 30d supply, fill #2

## 2024-03-28 MED ORDER — AMPHETAMINE-DEXTROAMPHETAMINE 15 MG PO TABS
22.5000 mg | ORAL_TABLET | Freq: Two times a day (BID) | ORAL | 0 refills | Status: DC
  Filled 2024-07-05: qty 90, 30d supply, fill #0

## 2024-03-28 MED ORDER — CITALOPRAM HYDROBROMIDE 20 MG PO TABS
20.0000 mg | ORAL_TABLET | Freq: Every day | ORAL | 2 refills | Status: AC
  Filled 2024-03-28: qty 30, 30d supply, fill #0

## 2024-03-28 MED ORDER — AMPHETAMINE-DEXTROAMPHETAMINE 15 MG PO TABS
22.5000 mg | ORAL_TABLET | Freq: Two times a day (BID) | ORAL | 0 refills | Status: AC
  Filled 2024-05-24: qty 90, 30d supply, fill #0

## 2024-03-29 ENCOUNTER — Other Ambulatory Visit (HOSPITAL_COMMUNITY): Payer: Self-pay

## 2024-03-29 ENCOUNTER — Other Ambulatory Visit: Payer: Self-pay

## 2024-05-17 ENCOUNTER — Other Ambulatory Visit: Payer: Self-pay

## 2024-05-17 ENCOUNTER — Other Ambulatory Visit (HOSPITAL_COMMUNITY): Payer: Self-pay

## 2024-05-18 ENCOUNTER — Other Ambulatory Visit (HOSPITAL_COMMUNITY): Payer: Self-pay

## 2024-05-19 ENCOUNTER — Other Ambulatory Visit (HOSPITAL_COMMUNITY): Payer: Self-pay

## 2024-05-23 ENCOUNTER — Other Ambulatory Visit (HOSPITAL_BASED_OUTPATIENT_CLINIC_OR_DEPARTMENT_OTHER): Payer: Self-pay

## 2024-05-24 ENCOUNTER — Other Ambulatory Visit: Payer: Self-pay

## 2024-05-24 ENCOUNTER — Other Ambulatory Visit (HOSPITAL_COMMUNITY): Payer: Self-pay

## 2024-07-05 ENCOUNTER — Other Ambulatory Visit (HOSPITAL_COMMUNITY): Payer: Self-pay

## 2024-07-05 ENCOUNTER — Other Ambulatory Visit: Payer: Self-pay

## 2024-08-07 ENCOUNTER — Other Ambulatory Visit: Payer: Self-pay

## 2024-08-07 ENCOUNTER — Other Ambulatory Visit (HOSPITAL_COMMUNITY): Payer: Self-pay

## 2024-08-07 MED ORDER — HYDROXYZINE HCL 25 MG PO TABS
25.0000 mg | ORAL_TABLET | Freq: Every evening | ORAL | 2 refills | Status: AC | PRN
  Filled 2024-08-07: qty 30, 30d supply, fill #0
  Filled 2024-09-27: qty 30, 30d supply, fill #1

## 2024-08-07 MED ORDER — PROPRANOLOL HCL 20 MG PO TABS
20.0000 mg | ORAL_TABLET | Freq: Two times a day (BID) | ORAL | 2 refills | Status: AC
  Filled 2024-08-07: qty 60, 30d supply, fill #0

## 2024-08-07 MED ORDER — AMPHETAMINE-DEXTROAMPHETAMINE 15 MG PO TABS
ORAL_TABLET | ORAL | 0 refills | Status: AC
  Filled 2024-11-15: qty 90, 30d supply, fill #0

## 2024-08-07 MED ORDER — AMPHETAMINE-DEXTROAMPHETAMINE 15 MG PO TABS
22.5000 mg | ORAL_TABLET | Freq: Two times a day (BID) | ORAL | 0 refills | Status: AC
  Filled 2024-08-07 – 2024-08-08 (×2): qty 90, 30d supply, fill #0

## 2024-08-07 MED ORDER — CITALOPRAM HYDROBROMIDE 20 MG PO TABS
20.0000 mg | ORAL_TABLET | Freq: Every day | ORAL | 2 refills | Status: AC
  Filled 2024-08-07: qty 30, 30d supply, fill #0

## 2024-08-07 MED ORDER — AMPHETAMINE-DEXTROAMPHETAMINE 15 MG PO TABS
22.5000 mg | ORAL_TABLET | Freq: Two times a day (BID) | ORAL | 0 refills | Status: AC
  Filled 2024-09-27: qty 90, 30d supply, fill #0

## 2024-08-08 ENCOUNTER — Other Ambulatory Visit: Payer: Self-pay

## 2024-08-08 ENCOUNTER — Other Ambulatory Visit (HOSPITAL_COMMUNITY): Payer: Self-pay

## 2024-09-27 ENCOUNTER — Other Ambulatory Visit (HOSPITAL_COMMUNITY): Payer: Self-pay

## 2024-09-27 ENCOUNTER — Other Ambulatory Visit: Payer: Self-pay

## 2024-09-28 ENCOUNTER — Other Ambulatory Visit: Payer: Self-pay

## 2024-11-02 ENCOUNTER — Other Ambulatory Visit (HOSPITAL_COMMUNITY): Payer: Self-pay

## 2024-11-02 ENCOUNTER — Other Ambulatory Visit: Payer: Self-pay

## 2024-11-03 ENCOUNTER — Other Ambulatory Visit (HOSPITAL_COMMUNITY): Payer: Self-pay

## 2024-11-10 ENCOUNTER — Other Ambulatory Visit (HOSPITAL_COMMUNITY): Payer: Self-pay

## 2024-11-13 ENCOUNTER — Other Ambulatory Visit (HOSPITAL_COMMUNITY): Payer: Self-pay

## 2024-11-15 ENCOUNTER — Other Ambulatory Visit (HOSPITAL_COMMUNITY): Payer: Self-pay
